# Patient Record
Sex: Female | Born: 1935
Health system: Southern US, Community
[De-identification: ages and names within clinical notes are randomized; demographics above are authoritative.]

## PROBLEM LIST (undated history)

## (undated) DIAGNOSIS — I10 Essential (primary) hypertension: Secondary | ICD-10-CM

## (undated) DIAGNOSIS — M1990 Unspecified osteoarthritis, unspecified site: Secondary | ICD-10-CM

## (undated) HISTORY — PX: CHOLECYSTECTOMY: SHX55

## (undated) HISTORY — PX: NECK SURGERY: SHX720

## (undated) HISTORY — PX: HERNIA REPAIR: SHX51

## (undated) HISTORY — PX: BACK SURGERY: SHX140

## (undated) HISTORY — PX: ABDOMINAL HYSTERECTOMY: SHX81

---

## 2015-05-05 DIAGNOSIS — E7800 Pure hypercholesterolemia, unspecified: Secondary | ICD-10-CM | POA: Diagnosis not present

## 2015-05-05 DIAGNOSIS — R300 Dysuria: Secondary | ICD-10-CM | POA: Diagnosis not present

## 2015-05-11 DIAGNOSIS — R609 Edema, unspecified: Secondary | ICD-10-CM | POA: Diagnosis not present

## 2015-05-11 DIAGNOSIS — R918 Other nonspecific abnormal finding of lung field: Secondary | ICD-10-CM | POA: Diagnosis not present

## 2015-05-11 DIAGNOSIS — R0609 Other forms of dyspnea: Secondary | ICD-10-CM | POA: Diagnosis not present

## 2015-05-11 DIAGNOSIS — I10 Essential (primary) hypertension: Secondary | ICD-10-CM | POA: Diagnosis not present

## 2015-05-11 DIAGNOSIS — J9811 Atelectasis: Secondary | ICD-10-CM | POA: Diagnosis not present

## 2015-05-11 DIAGNOSIS — E7800 Pure hypercholesterolemia, unspecified: Secondary | ICD-10-CM | POA: Diagnosis not present

## 2015-05-11 DIAGNOSIS — J9611 Chronic respiratory failure with hypoxia: Secondary | ICD-10-CM | POA: Diagnosis not present

## 2015-05-11 DIAGNOSIS — R0601 Orthopnea: Secondary | ICD-10-CM | POA: Diagnosis not present

## 2015-05-12 DIAGNOSIS — R079 Chest pain, unspecified: Secondary | ICD-10-CM | POA: Diagnosis not present

## 2015-05-12 DIAGNOSIS — Z87891 Personal history of nicotine dependence: Secondary | ICD-10-CM | POA: Diagnosis not present

## 2015-05-12 DIAGNOSIS — R7989 Other specified abnormal findings of blood chemistry: Secondary | ICD-10-CM | POA: Diagnosis not present

## 2015-05-12 DIAGNOSIS — I10 Essential (primary) hypertension: Secondary | ICD-10-CM | POA: Diagnosis not present

## 2015-05-12 DIAGNOSIS — Z85828 Personal history of other malignant neoplasm of skin: Secondary | ICD-10-CM | POA: Diagnosis not present

## 2015-05-12 DIAGNOSIS — I2510 Atherosclerotic heart disease of native coronary artery without angina pectoris: Secondary | ICD-10-CM | POA: Diagnosis not present

## 2015-05-12 DIAGNOSIS — K449 Diaphragmatic hernia without obstruction or gangrene: Secondary | ICD-10-CM | POA: Diagnosis not present

## 2015-05-12 DIAGNOSIS — I160 Hypertensive urgency: Secondary | ICD-10-CM | POA: Diagnosis not present

## 2015-05-12 DIAGNOSIS — R392 Extrarenal uremia: Secondary | ICD-10-CM | POA: Diagnosis not present

## 2015-05-12 DIAGNOSIS — K219 Gastro-esophageal reflux disease without esophagitis: Secondary | ICD-10-CM | POA: Diagnosis not present

## 2015-05-12 DIAGNOSIS — I252 Old myocardial infarction: Secondary | ICD-10-CM | POA: Diagnosis not present

## 2015-05-12 DIAGNOSIS — R0789 Other chest pain: Secondary | ICD-10-CM | POA: Diagnosis not present

## 2015-05-13 DIAGNOSIS — R0600 Dyspnea, unspecified: Secondary | ICD-10-CM | POA: Diagnosis not present

## 2015-05-17 DIAGNOSIS — R918 Other nonspecific abnormal finding of lung field: Secondary | ICD-10-CM | POA: Diagnosis not present

## 2015-05-17 DIAGNOSIS — I517 Cardiomegaly: Secondary | ICD-10-CM | POA: Diagnosis not present

## 2015-05-17 DIAGNOSIS — R0600 Dyspnea, unspecified: Secondary | ICD-10-CM | POA: Diagnosis not present

## 2015-05-17 DIAGNOSIS — I2510 Atherosclerotic heart disease of native coronary artery without angina pectoris: Secondary | ICD-10-CM | POA: Diagnosis not present

## 2015-05-17 DIAGNOSIS — K449 Diaphragmatic hernia without obstruction or gangrene: Secondary | ICD-10-CM | POA: Diagnosis not present

## 2015-05-17 DIAGNOSIS — I7090 Unspecified atherosclerosis: Secondary | ICD-10-CM | POA: Diagnosis not present

## 2015-06-07 DIAGNOSIS — R0600 Dyspnea, unspecified: Secondary | ICD-10-CM | POA: Diagnosis not present

## 2015-06-10 DIAGNOSIS — R0602 Shortness of breath: Secondary | ICD-10-CM | POA: Diagnosis not present

## 2015-07-22 DIAGNOSIS — M5431 Sciatica, right side: Secondary | ICD-10-CM | POA: Diagnosis not present

## 2015-07-26 DIAGNOSIS — M25551 Pain in right hip: Secondary | ICD-10-CM | POA: Diagnosis not present

## 2015-07-26 DIAGNOSIS — M545 Low back pain: Secondary | ICD-10-CM | POA: Diagnosis not present

## 2015-07-28 DIAGNOSIS — M25551 Pain in right hip: Secondary | ICD-10-CM | POA: Diagnosis not present

## 2015-07-28 DIAGNOSIS — M545 Low back pain: Secondary | ICD-10-CM | POA: Diagnosis not present

## 2015-07-30 DIAGNOSIS — M545 Low back pain: Secondary | ICD-10-CM | POA: Diagnosis not present

## 2015-07-30 DIAGNOSIS — M25551 Pain in right hip: Secondary | ICD-10-CM | POA: Diagnosis not present

## 2015-08-02 DIAGNOSIS — R0602 Shortness of breath: Secondary | ICD-10-CM | POA: Diagnosis not present

## 2015-08-02 DIAGNOSIS — J449 Chronic obstructive pulmonary disease, unspecified: Secondary | ICD-10-CM | POA: Diagnosis not present

## 2015-08-03 DIAGNOSIS — M25551 Pain in right hip: Secondary | ICD-10-CM | POA: Diagnosis not present

## 2015-08-03 DIAGNOSIS — M545 Low back pain: Secondary | ICD-10-CM | POA: Diagnosis not present

## 2015-08-05 DIAGNOSIS — M25551 Pain in right hip: Secondary | ICD-10-CM | POA: Diagnosis not present

## 2015-08-05 DIAGNOSIS — M545 Low back pain: Secondary | ICD-10-CM | POA: Diagnosis not present

## 2015-08-10 DIAGNOSIS — M545 Low back pain: Secondary | ICD-10-CM | POA: Diagnosis not present

## 2015-08-10 DIAGNOSIS — M25551 Pain in right hip: Secondary | ICD-10-CM | POA: Diagnosis not present

## 2015-08-12 DIAGNOSIS — M25551 Pain in right hip: Secondary | ICD-10-CM | POA: Diagnosis not present

## 2015-08-12 DIAGNOSIS — M545 Low back pain: Secondary | ICD-10-CM | POA: Diagnosis not present

## 2015-08-17 DIAGNOSIS — M545 Low back pain: Secondary | ICD-10-CM | POA: Diagnosis not present

## 2015-08-17 DIAGNOSIS — M25551 Pain in right hip: Secondary | ICD-10-CM | POA: Diagnosis not present

## 2015-08-19 DIAGNOSIS — M25551 Pain in right hip: Secondary | ICD-10-CM | POA: Diagnosis not present

## 2015-08-19 DIAGNOSIS — M545 Low back pain: Secondary | ICD-10-CM | POA: Diagnosis not present

## 2015-09-23 DIAGNOSIS — E7800 Pure hypercholesterolemia, unspecified: Secondary | ICD-10-CM | POA: Diagnosis not present

## 2015-09-23 DIAGNOSIS — I10 Essential (primary) hypertension: Secondary | ICD-10-CM | POA: Diagnosis not present

## 2015-09-30 DIAGNOSIS — R300 Dysuria: Secondary | ICD-10-CM | POA: Diagnosis not present

## 2015-09-30 DIAGNOSIS — R1084 Generalized abdominal pain: Secondary | ICD-10-CM | POA: Diagnosis not present

## 2015-09-30 DIAGNOSIS — E7800 Pure hypercholesterolemia, unspecified: Secondary | ICD-10-CM | POA: Diagnosis not present

## 2015-09-30 DIAGNOSIS — R0600 Dyspnea, unspecified: Secondary | ICD-10-CM | POA: Diagnosis not present

## 2015-09-30 DIAGNOSIS — I10 Essential (primary) hypertension: Secondary | ICD-10-CM | POA: Diagnosis not present

## 2015-10-14 DIAGNOSIS — K5730 Diverticulosis of large intestine without perforation or abscess without bleeding: Secondary | ICD-10-CM | POA: Diagnosis not present

## 2015-10-14 DIAGNOSIS — R1084 Generalized abdominal pain: Secondary | ICD-10-CM | POA: Diagnosis not present

## 2015-10-14 DIAGNOSIS — K449 Diaphragmatic hernia without obstruction or gangrene: Secondary | ICD-10-CM | POA: Diagnosis not present

## 2015-10-15 DIAGNOSIS — I10 Essential (primary) hypertension: Secondary | ICD-10-CM | POA: Diagnosis not present

## 2015-10-15 DIAGNOSIS — E785 Hyperlipidemia, unspecified: Secondary | ICD-10-CM | POA: Diagnosis not present

## 2015-10-15 DIAGNOSIS — R0609 Other forms of dyspnea: Secondary | ICD-10-CM | POA: Diagnosis not present

## 2015-10-15 DIAGNOSIS — K449 Diaphragmatic hernia without obstruction or gangrene: Secondary | ICD-10-CM | POA: Diagnosis not present

## 2015-10-22 DIAGNOSIS — R0609 Other forms of dyspnea: Secondary | ICD-10-CM | POA: Diagnosis not present

## 2015-10-22 DIAGNOSIS — R9431 Abnormal electrocardiogram [ECG] [EKG]: Secondary | ICD-10-CM | POA: Diagnosis not present

## 2015-10-22 DIAGNOSIS — I10 Essential (primary) hypertension: Secondary | ICD-10-CM | POA: Diagnosis not present

## 2015-10-27 DIAGNOSIS — R9431 Abnormal electrocardiogram [ECG] [EKG]: Secondary | ICD-10-CM | POA: Diagnosis not present

## 2015-10-27 DIAGNOSIS — R0609 Other forms of dyspnea: Secondary | ICD-10-CM | POA: Diagnosis not present

## 2015-10-27 DIAGNOSIS — I10 Essential (primary) hypertension: Secondary | ICD-10-CM | POA: Diagnosis not present

## 2015-11-01 DIAGNOSIS — E7800 Pure hypercholesterolemia, unspecified: Secondary | ICD-10-CM | POA: Diagnosis not present

## 2015-11-01 DIAGNOSIS — R0600 Dyspnea, unspecified: Secondary | ICD-10-CM | POA: Diagnosis not present

## 2016-01-27 DIAGNOSIS — M5441 Lumbago with sciatica, right side: Secondary | ICD-10-CM | POA: Diagnosis not present

## 2016-01-28 DIAGNOSIS — I2510 Atherosclerotic heart disease of native coronary artery without angina pectoris: Secondary | ICD-10-CM | POA: Diagnosis not present

## 2016-01-28 DIAGNOSIS — I252 Old myocardial infarction: Secondary | ICD-10-CM | POA: Diagnosis not present

## 2016-01-28 DIAGNOSIS — K219 Gastro-esophageal reflux disease without esophagitis: Secondary | ICD-10-CM | POA: Diagnosis not present

## 2016-01-28 DIAGNOSIS — Z87891 Personal history of nicotine dependence: Secondary | ICD-10-CM | POA: Diagnosis not present

## 2016-01-28 DIAGNOSIS — E785 Hyperlipidemia, unspecified: Secondary | ICD-10-CM | POA: Diagnosis not present

## 2016-01-28 DIAGNOSIS — M5441 Lumbago with sciatica, right side: Secondary | ICD-10-CM | POA: Diagnosis not present

## 2016-01-28 DIAGNOSIS — I10 Essential (primary) hypertension: Secondary | ICD-10-CM | POA: Diagnosis not present

## 2016-01-28 DIAGNOSIS — G8929 Other chronic pain: Secondary | ICD-10-CM | POA: Diagnosis not present

## 2016-01-28 DIAGNOSIS — M545 Low back pain: Secondary | ICD-10-CM | POA: Diagnosis not present

## 2016-01-28 DIAGNOSIS — K259 Gastric ulcer, unspecified as acute or chronic, without hemorrhage or perforation: Secondary | ICD-10-CM | POA: Diagnosis not present

## 2016-01-28 DIAGNOSIS — K449 Diaphragmatic hernia without obstruction or gangrene: Secondary | ICD-10-CM | POA: Diagnosis not present

## 2016-01-29 DIAGNOSIS — M545 Low back pain: Secondary | ICD-10-CM | POA: Diagnosis not present

## 2016-01-29 DIAGNOSIS — Z23 Encounter for immunization: Secondary | ICD-10-CM | POA: Diagnosis not present

## 2016-02-01 DIAGNOSIS — M5441 Lumbago with sciatica, right side: Secondary | ICD-10-CM | POA: Diagnosis not present

## 2016-02-07 DIAGNOSIS — E7800 Pure hypercholesterolemia, unspecified: Secondary | ICD-10-CM | POA: Diagnosis not present

## 2016-02-10 DIAGNOSIS — M5431 Sciatica, right side: Secondary | ICD-10-CM | POA: Diagnosis not present

## 2016-02-15 DIAGNOSIS — M4186 Other forms of scoliosis, lumbar region: Secondary | ICD-10-CM | POA: Diagnosis not present

## 2016-02-15 DIAGNOSIS — M545 Low back pain: Secondary | ICD-10-CM | POA: Diagnosis not present

## 2016-03-04 DIAGNOSIS — M545 Low back pain: Secondary | ICD-10-CM | POA: Diagnosis not present

## 2016-03-04 DIAGNOSIS — M4186 Other forms of scoliosis, lumbar region: Secondary | ICD-10-CM | POA: Diagnosis not present

## 2016-03-04 DIAGNOSIS — M5136 Other intervertebral disc degeneration, lumbar region: Secondary | ICD-10-CM | POA: Diagnosis not present

## 2016-03-07 DIAGNOSIS — M4186 Other forms of scoliosis, lumbar region: Secondary | ICD-10-CM | POA: Diagnosis not present

## 2016-05-18 DIAGNOSIS — M4327 Fusion of spine, lumbosacral region: Secondary | ICD-10-CM | POA: Diagnosis not present

## 2016-05-18 DIAGNOSIS — M9903 Segmental and somatic dysfunction of lumbar region: Secondary | ICD-10-CM | POA: Diagnosis not present

## 2016-05-18 DIAGNOSIS — M545 Low back pain: Secondary | ICD-10-CM | POA: Diagnosis not present

## 2016-05-18 DIAGNOSIS — M9904 Segmental and somatic dysfunction of sacral region: Secondary | ICD-10-CM | POA: Diagnosis not present

## 2016-05-19 DIAGNOSIS — M545 Low back pain: Secondary | ICD-10-CM | POA: Diagnosis not present

## 2016-05-19 DIAGNOSIS — M9904 Segmental and somatic dysfunction of sacral region: Secondary | ICD-10-CM | POA: Diagnosis not present

## 2016-05-19 DIAGNOSIS — M9903 Segmental and somatic dysfunction of lumbar region: Secondary | ICD-10-CM | POA: Diagnosis not present

## 2016-05-19 DIAGNOSIS — M4327 Fusion of spine, lumbosacral region: Secondary | ICD-10-CM | POA: Diagnosis not present

## 2016-05-22 DIAGNOSIS — M545 Low back pain: Secondary | ICD-10-CM | POA: Diagnosis not present

## 2016-05-22 DIAGNOSIS — M9904 Segmental and somatic dysfunction of sacral region: Secondary | ICD-10-CM | POA: Diagnosis not present

## 2016-05-22 DIAGNOSIS — M4327 Fusion of spine, lumbosacral region: Secondary | ICD-10-CM | POA: Diagnosis not present

## 2016-05-22 DIAGNOSIS — M9903 Segmental and somatic dysfunction of lumbar region: Secondary | ICD-10-CM | POA: Diagnosis not present

## 2016-05-25 DIAGNOSIS — M9903 Segmental and somatic dysfunction of lumbar region: Secondary | ICD-10-CM | POA: Diagnosis not present

## 2016-05-25 DIAGNOSIS — M4327 Fusion of spine, lumbosacral region: Secondary | ICD-10-CM | POA: Diagnosis not present

## 2016-05-25 DIAGNOSIS — M545 Low back pain: Secondary | ICD-10-CM | POA: Diagnosis not present

## 2016-05-25 DIAGNOSIS — M9904 Segmental and somatic dysfunction of sacral region: Secondary | ICD-10-CM | POA: Diagnosis not present

## 2016-05-26 DIAGNOSIS — M545 Low back pain: Secondary | ICD-10-CM | POA: Diagnosis not present

## 2016-05-26 DIAGNOSIS — M9904 Segmental and somatic dysfunction of sacral region: Secondary | ICD-10-CM | POA: Diagnosis not present

## 2016-05-26 DIAGNOSIS — M4327 Fusion of spine, lumbosacral region: Secondary | ICD-10-CM | POA: Diagnosis not present

## 2016-05-26 DIAGNOSIS — M9903 Segmental and somatic dysfunction of lumbar region: Secondary | ICD-10-CM | POA: Diagnosis not present

## 2016-05-29 DIAGNOSIS — M9903 Segmental and somatic dysfunction of lumbar region: Secondary | ICD-10-CM | POA: Diagnosis not present

## 2016-05-29 DIAGNOSIS — M4327 Fusion of spine, lumbosacral region: Secondary | ICD-10-CM | POA: Diagnosis not present

## 2016-05-29 DIAGNOSIS — M9904 Segmental and somatic dysfunction of sacral region: Secondary | ICD-10-CM | POA: Diagnosis not present

## 2016-05-29 DIAGNOSIS — M545 Low back pain: Secondary | ICD-10-CM | POA: Diagnosis not present

## 2016-05-31 DIAGNOSIS — M4327 Fusion of spine, lumbosacral region: Secondary | ICD-10-CM | POA: Diagnosis not present

## 2016-05-31 DIAGNOSIS — M9904 Segmental and somatic dysfunction of sacral region: Secondary | ICD-10-CM | POA: Diagnosis not present

## 2016-05-31 DIAGNOSIS — M9903 Segmental and somatic dysfunction of lumbar region: Secondary | ICD-10-CM | POA: Diagnosis not present

## 2016-05-31 DIAGNOSIS — M545 Low back pain: Secondary | ICD-10-CM | POA: Diagnosis not present

## 2016-06-01 DIAGNOSIS — M545 Low back pain: Secondary | ICD-10-CM | POA: Diagnosis not present

## 2016-06-01 DIAGNOSIS — M4327 Fusion of spine, lumbosacral region: Secondary | ICD-10-CM | POA: Diagnosis not present

## 2016-06-01 DIAGNOSIS — M9904 Segmental and somatic dysfunction of sacral region: Secondary | ICD-10-CM | POA: Diagnosis not present

## 2016-06-01 DIAGNOSIS — M9903 Segmental and somatic dysfunction of lumbar region: Secondary | ICD-10-CM | POA: Diagnosis not present

## 2016-06-02 DIAGNOSIS — M545 Low back pain: Secondary | ICD-10-CM | POA: Diagnosis not present

## 2016-06-02 DIAGNOSIS — M4327 Fusion of spine, lumbosacral region: Secondary | ICD-10-CM | POA: Diagnosis not present

## 2016-06-02 DIAGNOSIS — M9904 Segmental and somatic dysfunction of sacral region: Secondary | ICD-10-CM | POA: Diagnosis not present

## 2016-06-02 DIAGNOSIS — M9903 Segmental and somatic dysfunction of lumbar region: Secondary | ICD-10-CM | POA: Diagnosis not present

## 2016-06-05 DIAGNOSIS — M9904 Segmental and somatic dysfunction of sacral region: Secondary | ICD-10-CM | POA: Diagnosis not present

## 2016-06-05 DIAGNOSIS — M9903 Segmental and somatic dysfunction of lumbar region: Secondary | ICD-10-CM | POA: Diagnosis not present

## 2016-06-05 DIAGNOSIS — M545 Low back pain: Secondary | ICD-10-CM | POA: Diagnosis not present

## 2016-06-05 DIAGNOSIS — M4327 Fusion of spine, lumbosacral region: Secondary | ICD-10-CM | POA: Diagnosis not present

## 2016-06-07 DIAGNOSIS — M9903 Segmental and somatic dysfunction of lumbar region: Secondary | ICD-10-CM | POA: Diagnosis not present

## 2016-06-07 DIAGNOSIS — M4327 Fusion of spine, lumbosacral region: Secondary | ICD-10-CM | POA: Diagnosis not present

## 2016-06-07 DIAGNOSIS — M9904 Segmental and somatic dysfunction of sacral region: Secondary | ICD-10-CM | POA: Diagnosis not present

## 2016-06-07 DIAGNOSIS — M545 Low back pain: Secondary | ICD-10-CM | POA: Diagnosis not present

## 2016-06-08 DIAGNOSIS — M9903 Segmental and somatic dysfunction of lumbar region: Secondary | ICD-10-CM | POA: Diagnosis not present

## 2016-06-08 DIAGNOSIS — M545 Low back pain: Secondary | ICD-10-CM | POA: Diagnosis not present

## 2016-06-08 DIAGNOSIS — M4327 Fusion of spine, lumbosacral region: Secondary | ICD-10-CM | POA: Diagnosis not present

## 2016-06-08 DIAGNOSIS — M9904 Segmental and somatic dysfunction of sacral region: Secondary | ICD-10-CM | POA: Diagnosis not present

## 2016-06-09 DIAGNOSIS — M4327 Fusion of spine, lumbosacral region: Secondary | ICD-10-CM | POA: Diagnosis not present

## 2016-06-09 DIAGNOSIS — M545 Low back pain: Secondary | ICD-10-CM | POA: Diagnosis not present

## 2016-06-09 DIAGNOSIS — M9904 Segmental and somatic dysfunction of sacral region: Secondary | ICD-10-CM | POA: Diagnosis not present

## 2016-06-09 DIAGNOSIS — M9903 Segmental and somatic dysfunction of lumbar region: Secondary | ICD-10-CM | POA: Diagnosis not present

## 2016-06-19 DIAGNOSIS — E7800 Pure hypercholesterolemia, unspecified: Secondary | ICD-10-CM | POA: Diagnosis not present

## 2016-06-26 DIAGNOSIS — I10 Essential (primary) hypertension: Secondary | ICD-10-CM | POA: Diagnosis not present

## 2016-06-26 DIAGNOSIS — E7800 Pure hypercholesterolemia, unspecified: Secondary | ICD-10-CM | POA: Diagnosis not present

## 2016-09-20 DIAGNOSIS — B372 Candidiasis of skin and nail: Secondary | ICD-10-CM | POA: Diagnosis not present

## 2016-10-03 DIAGNOSIS — R21 Rash and other nonspecific skin eruption: Secondary | ICD-10-CM | POA: Diagnosis not present

## 2016-10-03 DIAGNOSIS — E7800 Pure hypercholesterolemia, unspecified: Secondary | ICD-10-CM | POA: Diagnosis not present

## 2016-10-04 DIAGNOSIS — H43811 Vitreous degeneration, right eye: Secondary | ICD-10-CM | POA: Diagnosis not present

## 2016-10-10 DIAGNOSIS — K219 Gastro-esophageal reflux disease without esophagitis: Secondary | ICD-10-CM | POA: Diagnosis not present

## 2016-10-10 DIAGNOSIS — I10 Essential (primary) hypertension: Secondary | ICD-10-CM | POA: Diagnosis not present

## 2016-10-10 DIAGNOSIS — K210 Gastro-esophageal reflux disease with esophagitis: Secondary | ICD-10-CM | POA: Diagnosis not present

## 2016-10-10 DIAGNOSIS — R252 Cramp and spasm: Secondary | ICD-10-CM | POA: Diagnosis not present

## 2016-10-10 DIAGNOSIS — D750 Familial erythrocytosis: Secondary | ICD-10-CM | POA: Diagnosis not present

## 2016-10-10 DIAGNOSIS — E7800 Pure hypercholesterolemia, unspecified: Secondary | ICD-10-CM | POA: Diagnosis not present

## 2016-10-10 DIAGNOSIS — M791 Myalgia: Secondary | ICD-10-CM | POA: Diagnosis not present

## 2016-10-17 DIAGNOSIS — R252 Cramp and spasm: Secondary | ICD-10-CM | POA: Diagnosis not present

## 2016-10-17 DIAGNOSIS — I10 Essential (primary) hypertension: Secondary | ICD-10-CM | POA: Diagnosis not present

## 2016-10-31 DIAGNOSIS — I10 Essential (primary) hypertension: Secondary | ICD-10-CM | POA: Diagnosis not present

## 2016-10-31 DIAGNOSIS — E7800 Pure hypercholesterolemia, unspecified: Secondary | ICD-10-CM | POA: Diagnosis not present

## 2016-11-16 DIAGNOSIS — M5136 Other intervertebral disc degeneration, lumbar region: Secondary | ICD-10-CM | POA: Diagnosis not present

## 2016-11-16 DIAGNOSIS — M9905 Segmental and somatic dysfunction of pelvic region: Secondary | ICD-10-CM | POA: Diagnosis not present

## 2016-11-16 DIAGNOSIS — M6240 Contracture of muscle, unspecified site: Secondary | ICD-10-CM | POA: Diagnosis not present

## 2016-11-16 DIAGNOSIS — M9903 Segmental and somatic dysfunction of lumbar region: Secondary | ICD-10-CM | POA: Diagnosis not present

## 2016-11-20 DIAGNOSIS — M9903 Segmental and somatic dysfunction of lumbar region: Secondary | ICD-10-CM | POA: Diagnosis not present

## 2016-11-20 DIAGNOSIS — M6240 Contracture of muscle, unspecified site: Secondary | ICD-10-CM | POA: Diagnosis not present

## 2016-11-20 DIAGNOSIS — M9905 Segmental and somatic dysfunction of pelvic region: Secondary | ICD-10-CM | POA: Diagnosis not present

## 2016-11-20 DIAGNOSIS — M5136 Other intervertebral disc degeneration, lumbar region: Secondary | ICD-10-CM | POA: Diagnosis not present

## 2016-11-21 DIAGNOSIS — M5136 Other intervertebral disc degeneration, lumbar region: Secondary | ICD-10-CM | POA: Diagnosis not present

## 2016-11-21 DIAGNOSIS — M9903 Segmental and somatic dysfunction of lumbar region: Secondary | ICD-10-CM | POA: Diagnosis not present

## 2016-11-21 DIAGNOSIS — M6240 Contracture of muscle, unspecified site: Secondary | ICD-10-CM | POA: Diagnosis not present

## 2016-11-21 DIAGNOSIS — M9905 Segmental and somatic dysfunction of pelvic region: Secondary | ICD-10-CM | POA: Diagnosis not present

## 2016-11-23 DIAGNOSIS — M9905 Segmental and somatic dysfunction of pelvic region: Secondary | ICD-10-CM | POA: Diagnosis not present

## 2016-11-23 DIAGNOSIS — M5136 Other intervertebral disc degeneration, lumbar region: Secondary | ICD-10-CM | POA: Diagnosis not present

## 2016-11-23 DIAGNOSIS — M9903 Segmental and somatic dysfunction of lumbar region: Secondary | ICD-10-CM | POA: Diagnosis not present

## 2016-11-23 DIAGNOSIS — M6240 Contracture of muscle, unspecified site: Secondary | ICD-10-CM | POA: Diagnosis not present

## 2016-11-28 DIAGNOSIS — M6240 Contracture of muscle, unspecified site: Secondary | ICD-10-CM | POA: Diagnosis not present

## 2016-11-28 DIAGNOSIS — M9903 Segmental and somatic dysfunction of lumbar region: Secondary | ICD-10-CM | POA: Diagnosis not present

## 2016-11-28 DIAGNOSIS — M5136 Other intervertebral disc degeneration, lumbar region: Secondary | ICD-10-CM | POA: Diagnosis not present

## 2016-11-28 DIAGNOSIS — M9905 Segmental and somatic dysfunction of pelvic region: Secondary | ICD-10-CM | POA: Diagnosis not present

## 2016-11-30 DIAGNOSIS — M5136 Other intervertebral disc degeneration, lumbar region: Secondary | ICD-10-CM | POA: Diagnosis not present

## 2016-11-30 DIAGNOSIS — M9905 Segmental and somatic dysfunction of pelvic region: Secondary | ICD-10-CM | POA: Diagnosis not present

## 2016-11-30 DIAGNOSIS — M9903 Segmental and somatic dysfunction of lumbar region: Secondary | ICD-10-CM | POA: Diagnosis not present

## 2016-11-30 DIAGNOSIS — M6240 Contracture of muscle, unspecified site: Secondary | ICD-10-CM | POA: Diagnosis not present

## 2016-12-06 DIAGNOSIS — M9903 Segmental and somatic dysfunction of lumbar region: Secondary | ICD-10-CM | POA: Diagnosis not present

## 2016-12-06 DIAGNOSIS — M6240 Contracture of muscle, unspecified site: Secondary | ICD-10-CM | POA: Diagnosis not present

## 2016-12-06 DIAGNOSIS — M9905 Segmental and somatic dysfunction of pelvic region: Secondary | ICD-10-CM | POA: Diagnosis not present

## 2016-12-06 DIAGNOSIS — M5136 Other intervertebral disc degeneration, lumbar region: Secondary | ICD-10-CM | POA: Diagnosis not present

## 2016-12-07 DIAGNOSIS — M9903 Segmental and somatic dysfunction of lumbar region: Secondary | ICD-10-CM | POA: Diagnosis not present

## 2016-12-07 DIAGNOSIS — M6240 Contracture of muscle, unspecified site: Secondary | ICD-10-CM | POA: Diagnosis not present

## 2016-12-07 DIAGNOSIS — M5136 Other intervertebral disc degeneration, lumbar region: Secondary | ICD-10-CM | POA: Diagnosis not present

## 2016-12-07 DIAGNOSIS — M9905 Segmental and somatic dysfunction of pelvic region: Secondary | ICD-10-CM | POA: Diagnosis not present

## 2016-12-21 DIAGNOSIS — M6240 Contracture of muscle, unspecified site: Secondary | ICD-10-CM | POA: Diagnosis not present

## 2016-12-21 DIAGNOSIS — M9905 Segmental and somatic dysfunction of pelvic region: Secondary | ICD-10-CM | POA: Diagnosis not present

## 2016-12-21 DIAGNOSIS — M5136 Other intervertebral disc degeneration, lumbar region: Secondary | ICD-10-CM | POA: Diagnosis not present

## 2016-12-21 DIAGNOSIS — M9903 Segmental and somatic dysfunction of lumbar region: Secondary | ICD-10-CM | POA: Diagnosis not present

## 2017-01-25 DIAGNOSIS — I10 Essential (primary) hypertension: Secondary | ICD-10-CM | POA: Diagnosis not present

## 2017-01-25 DIAGNOSIS — H6980 Other specified disorders of Eustachian tube, unspecified ear: Secondary | ICD-10-CM | POA: Diagnosis not present

## 2017-01-25 DIAGNOSIS — R42 Dizziness and giddiness: Secondary | ICD-10-CM | POA: Diagnosis not present

## 2017-03-26 DIAGNOSIS — D485 Neoplasm of uncertain behavior of skin: Secondary | ICD-10-CM | POA: Diagnosis not present

## 2017-03-26 DIAGNOSIS — D1720 Benign lipomatous neoplasm of skin and subcutaneous tissue of unspecified limb: Secondary | ICD-10-CM | POA: Diagnosis not present

## 2017-03-26 DIAGNOSIS — D0461 Carcinoma in situ of skin of right upper limb, including shoulder: Secondary | ICD-10-CM | POA: Diagnosis not present

## 2017-03-26 DIAGNOSIS — Z85828 Personal history of other malignant neoplasm of skin: Secondary | ICD-10-CM | POA: Diagnosis not present

## 2017-03-26 DIAGNOSIS — Z23 Encounter for immunization: Secondary | ICD-10-CM | POA: Diagnosis not present

## 2017-03-26 DIAGNOSIS — L821 Other seborrheic keratosis: Secondary | ICD-10-CM | POA: Diagnosis not present

## 2017-03-26 DIAGNOSIS — L570 Actinic keratosis: Secondary | ICD-10-CM | POA: Diagnosis not present

## 2017-04-09 DIAGNOSIS — M5441 Lumbago with sciatica, right side: Secondary | ICD-10-CM | POA: Diagnosis not present

## 2017-04-09 DIAGNOSIS — Z1231 Encounter for screening mammogram for malignant neoplasm of breast: Secondary | ICD-10-CM | POA: Diagnosis not present

## 2017-04-09 DIAGNOSIS — K219 Gastro-esophageal reflux disease without esophagitis: Secondary | ICD-10-CM | POA: Diagnosis not present

## 2017-04-09 DIAGNOSIS — M5442 Lumbago with sciatica, left side: Secondary | ICD-10-CM | POA: Diagnosis not present

## 2017-04-09 DIAGNOSIS — I10 Essential (primary) hypertension: Secondary | ICD-10-CM | POA: Diagnosis not present

## 2017-04-09 DIAGNOSIS — G8929 Other chronic pain: Secondary | ICD-10-CM | POA: Diagnosis not present

## 2017-04-12 ENCOUNTER — Other Ambulatory Visit: Payer: Self-pay | Admitting: Family Medicine

## 2017-04-12 DIAGNOSIS — Z1231 Encounter for screening mammogram for malignant neoplasm of breast: Secondary | ICD-10-CM

## 2017-04-26 ENCOUNTER — Ambulatory Visit
Admission: RE | Admit: 2017-04-26 | Discharge: 2017-04-26 | Disposition: A | Discharge status: Home / Self Care | Payer: Medicare Other | Source: Ambulatory Visit | Attending: Family Medicine | Admitting: Family Medicine

## 2017-04-26 DIAGNOSIS — Z1231 Encounter for screening mammogram for malignant neoplasm of breast: Secondary | ICD-10-CM | POA: Diagnosis not present

## 2017-05-01 DIAGNOSIS — Z411 Encounter for cosmetic surgery: Secondary | ICD-10-CM | POA: Diagnosis not present

## 2017-05-01 DIAGNOSIS — D0461 Carcinoma in situ of skin of right upper limb, including shoulder: Secondary | ICD-10-CM | POA: Diagnosis not present

## 2017-06-04 DIAGNOSIS — R109 Unspecified abdominal pain: Secondary | ICD-10-CM | POA: Diagnosis not present

## 2017-06-04 DIAGNOSIS — K449 Diaphragmatic hernia without obstruction or gangrene: Secondary | ICD-10-CM | POA: Diagnosis not present

## 2017-06-04 DIAGNOSIS — R1030 Lower abdominal pain, unspecified: Secondary | ICD-10-CM | POA: Diagnosis not present

## 2017-06-04 DIAGNOSIS — Z9049 Acquired absence of other specified parts of digestive tract: Secondary | ICD-10-CM | POA: Diagnosis not present

## 2017-06-04 DIAGNOSIS — M79604 Pain in right leg: Secondary | ICD-10-CM | POA: Diagnosis not present

## 2017-06-04 DIAGNOSIS — I10 Essential (primary) hypertension: Secondary | ICD-10-CM | POA: Diagnosis not present

## 2017-06-04 DIAGNOSIS — N390 Urinary tract infection, site not specified: Secondary | ICD-10-CM | POA: Diagnosis not present

## 2017-06-04 DIAGNOSIS — I700 Atherosclerosis of aorta: Secondary | ICD-10-CM | POA: Diagnosis not present

## 2017-06-04 DIAGNOSIS — R350 Frequency of micturition: Secondary | ICD-10-CM | POA: Diagnosis not present

## 2017-06-04 DIAGNOSIS — K5790 Diverticulosis of intestine, part unspecified, without perforation or abscess without bleeding: Secondary | ICD-10-CM | POA: Diagnosis not present

## 2017-06-04 DIAGNOSIS — Z880 Allergy status to penicillin: Secondary | ICD-10-CM | POA: Diagnosis not present

## 2017-06-04 DIAGNOSIS — B9689 Other specified bacterial agents as the cause of diseases classified elsewhere: Secondary | ICD-10-CM | POA: Diagnosis not present

## 2017-06-12 DIAGNOSIS — G8929 Other chronic pain: Secondary | ICD-10-CM | POA: Diagnosis not present

## 2017-06-12 DIAGNOSIS — N390 Urinary tract infection, site not specified: Secondary | ICD-10-CM | POA: Diagnosis not present

## 2017-06-12 DIAGNOSIS — R05 Cough: Secondary | ICD-10-CM | POA: Diagnosis not present

## 2017-06-12 DIAGNOSIS — M5442 Lumbago with sciatica, left side: Secondary | ICD-10-CM | POA: Diagnosis not present

## 2017-06-12 DIAGNOSIS — J069 Acute upper respiratory infection, unspecified: Secondary | ICD-10-CM | POA: Diagnosis not present

## 2017-07-25 DIAGNOSIS — M545 Low back pain: Secondary | ICD-10-CM | POA: Diagnosis not present

## 2017-08-16 DIAGNOSIS — M545 Low back pain: Secondary | ICD-10-CM | POA: Diagnosis not present

## 2017-08-16 DIAGNOSIS — M4150 Other secondary scoliosis, site unspecified: Secondary | ICD-10-CM | POA: Diagnosis not present

## 2017-08-16 DIAGNOSIS — M5432 Sciatica, left side: Secondary | ICD-10-CM | POA: Diagnosis not present

## 2017-08-16 DIAGNOSIS — M5431 Sciatica, right side: Secondary | ICD-10-CM | POA: Diagnosis not present

## 2017-08-16 DIAGNOSIS — M549 Dorsalgia, unspecified: Secondary | ICD-10-CM | POA: Diagnosis not present

## 2017-08-26 DIAGNOSIS — N390 Urinary tract infection, site not specified: Secondary | ICD-10-CM | POA: Diagnosis not present

## 2017-08-26 DIAGNOSIS — R109 Unspecified abdominal pain: Secondary | ICD-10-CM | POA: Diagnosis not present

## 2017-09-10 DIAGNOSIS — M5442 Lumbago with sciatica, left side: Secondary | ICD-10-CM | POA: Diagnosis not present

## 2017-10-12 DIAGNOSIS — N3941 Urge incontinence: Secondary | ICD-10-CM | POA: Diagnosis not present

## 2017-10-12 DIAGNOSIS — R32 Unspecified urinary incontinence: Secondary | ICD-10-CM | POA: Diagnosis not present

## 2017-12-12 DIAGNOSIS — M5442 Lumbago with sciatica, left side: Secondary | ICD-10-CM | POA: Diagnosis not present

## 2017-12-12 DIAGNOSIS — I10 Essential (primary) hypertension: Secondary | ICD-10-CM | POA: Diagnosis not present

## 2017-12-12 DIAGNOSIS — R221 Localized swelling, mass and lump, neck: Secondary | ICD-10-CM | POA: Diagnosis not present

## 2017-12-12 DIAGNOSIS — R1310 Dysphagia, unspecified: Secondary | ICD-10-CM | POA: Diagnosis not present

## 2017-12-14 DIAGNOSIS — N3941 Urge incontinence: Secondary | ICD-10-CM | POA: Diagnosis not present

## 2018-07-11 DIAGNOSIS — M5416 Radiculopathy, lumbar region: Secondary | ICD-10-CM | POA: Diagnosis not present

## 2018-07-11 DIAGNOSIS — M9905 Segmental and somatic dysfunction of pelvic region: Secondary | ICD-10-CM | POA: Diagnosis not present

## 2018-07-11 DIAGNOSIS — R51 Headache: Secondary | ICD-10-CM | POA: Diagnosis not present

## 2018-07-11 DIAGNOSIS — M530 Cervicocranial syndrome: Secondary | ICD-10-CM | POA: Diagnosis not present

## 2018-07-11 DIAGNOSIS — M9904 Segmental and somatic dysfunction of sacral region: Secondary | ICD-10-CM | POA: Diagnosis not present

## 2018-07-11 DIAGNOSIS — M9901 Segmental and somatic dysfunction of cervical region: Secondary | ICD-10-CM | POA: Diagnosis not present

## 2018-07-11 DIAGNOSIS — M5136 Other intervertebral disc degeneration, lumbar region: Secondary | ICD-10-CM | POA: Diagnosis not present

## 2018-07-11 DIAGNOSIS — M4312 Spondylolisthesis, cervical region: Secondary | ICD-10-CM | POA: Diagnosis not present

## 2018-07-11 DIAGNOSIS — M9903 Segmental and somatic dysfunction of lumbar region: Secondary | ICD-10-CM | POA: Diagnosis not present

## 2018-07-12 DIAGNOSIS — M5416 Radiculopathy, lumbar region: Secondary | ICD-10-CM | POA: Diagnosis not present

## 2018-07-12 DIAGNOSIS — R51 Headache: Secondary | ICD-10-CM | POA: Diagnosis not present

## 2018-07-12 DIAGNOSIS — M9903 Segmental and somatic dysfunction of lumbar region: Secondary | ICD-10-CM | POA: Diagnosis not present

## 2018-07-12 DIAGNOSIS — M9901 Segmental and somatic dysfunction of cervical region: Secondary | ICD-10-CM | POA: Diagnosis not present

## 2018-07-12 DIAGNOSIS — M4312 Spondylolisthesis, cervical region: Secondary | ICD-10-CM | POA: Diagnosis not present

## 2018-07-12 DIAGNOSIS — M530 Cervicocranial syndrome: Secondary | ICD-10-CM | POA: Diagnosis not present

## 2018-07-12 DIAGNOSIS — M9904 Segmental and somatic dysfunction of sacral region: Secondary | ICD-10-CM | POA: Diagnosis not present

## 2018-07-12 DIAGNOSIS — M5136 Other intervertebral disc degeneration, lumbar region: Secondary | ICD-10-CM | POA: Diagnosis not present

## 2018-07-12 DIAGNOSIS — M9905 Segmental and somatic dysfunction of pelvic region: Secondary | ICD-10-CM | POA: Diagnosis not present

## 2018-07-15 DIAGNOSIS — M5136 Other intervertebral disc degeneration, lumbar region: Secondary | ICD-10-CM | POA: Diagnosis not present

## 2018-07-15 DIAGNOSIS — M9905 Segmental and somatic dysfunction of pelvic region: Secondary | ICD-10-CM | POA: Diagnosis not present

## 2018-07-15 DIAGNOSIS — M9904 Segmental and somatic dysfunction of sacral region: Secondary | ICD-10-CM | POA: Diagnosis not present

## 2018-07-15 DIAGNOSIS — M530 Cervicocranial syndrome: Secondary | ICD-10-CM | POA: Diagnosis not present

## 2018-07-15 DIAGNOSIS — M9903 Segmental and somatic dysfunction of lumbar region: Secondary | ICD-10-CM | POA: Diagnosis not present

## 2018-07-15 DIAGNOSIS — M5416 Radiculopathy, lumbar region: Secondary | ICD-10-CM | POA: Diagnosis not present

## 2018-07-15 DIAGNOSIS — R51 Headache: Secondary | ICD-10-CM | POA: Diagnosis not present

## 2018-07-15 DIAGNOSIS — M9901 Segmental and somatic dysfunction of cervical region: Secondary | ICD-10-CM | POA: Diagnosis not present

## 2018-07-15 DIAGNOSIS — M4312 Spondylolisthesis, cervical region: Secondary | ICD-10-CM | POA: Diagnosis not present

## 2018-07-19 DIAGNOSIS — M9905 Segmental and somatic dysfunction of pelvic region: Secondary | ICD-10-CM | POA: Diagnosis not present

## 2018-07-19 DIAGNOSIS — M9901 Segmental and somatic dysfunction of cervical region: Secondary | ICD-10-CM | POA: Diagnosis not present

## 2018-07-19 DIAGNOSIS — M9904 Segmental and somatic dysfunction of sacral region: Secondary | ICD-10-CM | POA: Diagnosis not present

## 2018-07-19 DIAGNOSIS — M5136 Other intervertebral disc degeneration, lumbar region: Secondary | ICD-10-CM | POA: Diagnosis not present

## 2018-07-19 DIAGNOSIS — R51 Headache: Secondary | ICD-10-CM | POA: Diagnosis not present

## 2018-07-19 DIAGNOSIS — M530 Cervicocranial syndrome: Secondary | ICD-10-CM | POA: Diagnosis not present

## 2018-07-19 DIAGNOSIS — M5416 Radiculopathy, lumbar region: Secondary | ICD-10-CM | POA: Diagnosis not present

## 2018-07-19 DIAGNOSIS — M4312 Spondylolisthesis, cervical region: Secondary | ICD-10-CM | POA: Diagnosis not present

## 2018-07-19 DIAGNOSIS — M9903 Segmental and somatic dysfunction of lumbar region: Secondary | ICD-10-CM | POA: Diagnosis not present

## 2018-07-24 DIAGNOSIS — M9905 Segmental and somatic dysfunction of pelvic region: Secondary | ICD-10-CM | POA: Diagnosis not present

## 2018-07-24 DIAGNOSIS — M9903 Segmental and somatic dysfunction of lumbar region: Secondary | ICD-10-CM | POA: Diagnosis not present

## 2018-07-24 DIAGNOSIS — M4312 Spondylolisthesis, cervical region: Secondary | ICD-10-CM | POA: Diagnosis not present

## 2018-07-24 DIAGNOSIS — M9904 Segmental and somatic dysfunction of sacral region: Secondary | ICD-10-CM | POA: Diagnosis not present

## 2018-07-24 DIAGNOSIS — M9901 Segmental and somatic dysfunction of cervical region: Secondary | ICD-10-CM | POA: Diagnosis not present

## 2018-07-24 DIAGNOSIS — M530 Cervicocranial syndrome: Secondary | ICD-10-CM | POA: Diagnosis not present

## 2018-07-24 DIAGNOSIS — R51 Headache: Secondary | ICD-10-CM | POA: Diagnosis not present

## 2018-07-24 DIAGNOSIS — M5416 Radiculopathy, lumbar region: Secondary | ICD-10-CM | POA: Diagnosis not present

## 2018-07-24 DIAGNOSIS — M5136 Other intervertebral disc degeneration, lumbar region: Secondary | ICD-10-CM | POA: Diagnosis not present

## 2018-07-29 DIAGNOSIS — M9904 Segmental and somatic dysfunction of sacral region: Secondary | ICD-10-CM | POA: Diagnosis not present

## 2018-07-29 DIAGNOSIS — M9905 Segmental and somatic dysfunction of pelvic region: Secondary | ICD-10-CM | POA: Diagnosis not present

## 2018-07-29 DIAGNOSIS — M4312 Spondylolisthesis, cervical region: Secondary | ICD-10-CM | POA: Diagnosis not present

## 2018-07-29 DIAGNOSIS — M5416 Radiculopathy, lumbar region: Secondary | ICD-10-CM | POA: Diagnosis not present

## 2018-07-29 DIAGNOSIS — M530 Cervicocranial syndrome: Secondary | ICD-10-CM | POA: Diagnosis not present

## 2018-07-29 DIAGNOSIS — R51 Headache: Secondary | ICD-10-CM | POA: Diagnosis not present

## 2018-07-29 DIAGNOSIS — M9901 Segmental and somatic dysfunction of cervical region: Secondary | ICD-10-CM | POA: Diagnosis not present

## 2018-07-29 DIAGNOSIS — M9903 Segmental and somatic dysfunction of lumbar region: Secondary | ICD-10-CM | POA: Diagnosis not present

## 2018-07-29 DIAGNOSIS — M5136 Other intervertebral disc degeneration, lumbar region: Secondary | ICD-10-CM | POA: Diagnosis not present

## 2018-07-31 DIAGNOSIS — M9903 Segmental and somatic dysfunction of lumbar region: Secondary | ICD-10-CM | POA: Diagnosis not present

## 2018-07-31 DIAGNOSIS — M530 Cervicocranial syndrome: Secondary | ICD-10-CM | POA: Diagnosis not present

## 2018-07-31 DIAGNOSIS — M4312 Spondylolisthesis, cervical region: Secondary | ICD-10-CM | POA: Diagnosis not present

## 2018-07-31 DIAGNOSIS — M5136 Other intervertebral disc degeneration, lumbar region: Secondary | ICD-10-CM | POA: Diagnosis not present

## 2018-07-31 DIAGNOSIS — M9901 Segmental and somatic dysfunction of cervical region: Secondary | ICD-10-CM | POA: Diagnosis not present

## 2018-07-31 DIAGNOSIS — R51 Headache: Secondary | ICD-10-CM | POA: Diagnosis not present

## 2018-07-31 DIAGNOSIS — M9905 Segmental and somatic dysfunction of pelvic region: Secondary | ICD-10-CM | POA: Diagnosis not present

## 2018-07-31 DIAGNOSIS — M5416 Radiculopathy, lumbar region: Secondary | ICD-10-CM | POA: Diagnosis not present

## 2018-07-31 DIAGNOSIS — M9904 Segmental and somatic dysfunction of sacral region: Secondary | ICD-10-CM | POA: Diagnosis not present

## 2018-08-01 DIAGNOSIS — M530 Cervicocranial syndrome: Secondary | ICD-10-CM | POA: Diagnosis not present

## 2018-08-01 DIAGNOSIS — M9904 Segmental and somatic dysfunction of sacral region: Secondary | ICD-10-CM | POA: Diagnosis not present

## 2018-08-01 DIAGNOSIS — M4312 Spondylolisthesis, cervical region: Secondary | ICD-10-CM | POA: Diagnosis not present

## 2018-08-01 DIAGNOSIS — M9905 Segmental and somatic dysfunction of pelvic region: Secondary | ICD-10-CM | POA: Diagnosis not present

## 2018-08-01 DIAGNOSIS — M5416 Radiculopathy, lumbar region: Secondary | ICD-10-CM | POA: Diagnosis not present

## 2018-08-01 DIAGNOSIS — R51 Headache: Secondary | ICD-10-CM | POA: Diagnosis not present

## 2018-08-01 DIAGNOSIS — M9903 Segmental and somatic dysfunction of lumbar region: Secondary | ICD-10-CM | POA: Diagnosis not present

## 2018-08-01 DIAGNOSIS — M9901 Segmental and somatic dysfunction of cervical region: Secondary | ICD-10-CM | POA: Diagnosis not present

## 2018-08-01 DIAGNOSIS — M5136 Other intervertebral disc degeneration, lumbar region: Secondary | ICD-10-CM | POA: Diagnosis not present

## 2018-08-07 DIAGNOSIS — M9904 Segmental and somatic dysfunction of sacral region: Secondary | ICD-10-CM | POA: Diagnosis not present

## 2018-08-07 DIAGNOSIS — M4312 Spondylolisthesis, cervical region: Secondary | ICD-10-CM | POA: Diagnosis not present

## 2018-08-07 DIAGNOSIS — M530 Cervicocranial syndrome: Secondary | ICD-10-CM | POA: Diagnosis not present

## 2018-08-07 DIAGNOSIS — M5416 Radiculopathy, lumbar region: Secondary | ICD-10-CM | POA: Diagnosis not present

## 2018-08-07 DIAGNOSIS — M9903 Segmental and somatic dysfunction of lumbar region: Secondary | ICD-10-CM | POA: Diagnosis not present

## 2018-08-07 DIAGNOSIS — M9905 Segmental and somatic dysfunction of pelvic region: Secondary | ICD-10-CM | POA: Diagnosis not present

## 2018-08-07 DIAGNOSIS — M5136 Other intervertebral disc degeneration, lumbar region: Secondary | ICD-10-CM | POA: Diagnosis not present

## 2018-08-07 DIAGNOSIS — R51 Headache: Secondary | ICD-10-CM | POA: Diagnosis not present

## 2018-08-07 DIAGNOSIS — M9901 Segmental and somatic dysfunction of cervical region: Secondary | ICD-10-CM | POA: Diagnosis not present

## 2018-08-12 DIAGNOSIS — M5416 Radiculopathy, lumbar region: Secondary | ICD-10-CM | POA: Diagnosis not present

## 2018-08-12 DIAGNOSIS — M5136 Other intervertebral disc degeneration, lumbar region: Secondary | ICD-10-CM | POA: Diagnosis not present

## 2018-08-12 DIAGNOSIS — M9901 Segmental and somatic dysfunction of cervical region: Secondary | ICD-10-CM | POA: Diagnosis not present

## 2018-08-12 DIAGNOSIS — M530 Cervicocranial syndrome: Secondary | ICD-10-CM | POA: Diagnosis not present

## 2018-08-12 DIAGNOSIS — R51 Headache: Secondary | ICD-10-CM | POA: Diagnosis not present

## 2018-08-12 DIAGNOSIS — M9903 Segmental and somatic dysfunction of lumbar region: Secondary | ICD-10-CM | POA: Diagnosis not present

## 2018-08-12 DIAGNOSIS — M9904 Segmental and somatic dysfunction of sacral region: Secondary | ICD-10-CM | POA: Diagnosis not present

## 2018-08-12 DIAGNOSIS — M9905 Segmental and somatic dysfunction of pelvic region: Secondary | ICD-10-CM | POA: Diagnosis not present

## 2018-08-12 DIAGNOSIS — M4312 Spondylolisthesis, cervical region: Secondary | ICD-10-CM | POA: Diagnosis not present

## 2018-08-21 DIAGNOSIS — M9901 Segmental and somatic dysfunction of cervical region: Secondary | ICD-10-CM | POA: Diagnosis not present

## 2018-08-21 DIAGNOSIS — M9905 Segmental and somatic dysfunction of pelvic region: Secondary | ICD-10-CM | POA: Diagnosis not present

## 2018-08-21 DIAGNOSIS — M9903 Segmental and somatic dysfunction of lumbar region: Secondary | ICD-10-CM | POA: Diagnosis not present

## 2018-08-21 DIAGNOSIS — R51 Headache: Secondary | ICD-10-CM | POA: Diagnosis not present

## 2018-08-21 DIAGNOSIS — M4312 Spondylolisthesis, cervical region: Secondary | ICD-10-CM | POA: Diagnosis not present

## 2018-08-21 DIAGNOSIS — M5136 Other intervertebral disc degeneration, lumbar region: Secondary | ICD-10-CM | POA: Diagnosis not present

## 2018-08-21 DIAGNOSIS — M9904 Segmental and somatic dysfunction of sacral region: Secondary | ICD-10-CM | POA: Diagnosis not present

## 2018-08-21 DIAGNOSIS — M530 Cervicocranial syndrome: Secondary | ICD-10-CM | POA: Diagnosis not present

## 2018-08-21 DIAGNOSIS — M5416 Radiculopathy, lumbar region: Secondary | ICD-10-CM | POA: Diagnosis not present

## 2019-05-12 DIAGNOSIS — E7800 Pure hypercholesterolemia, unspecified: Secondary | ICD-10-CM | POA: Diagnosis not present

## 2019-05-12 DIAGNOSIS — Z789 Other specified health status: Secondary | ICD-10-CM | POA: Diagnosis not present

## 2019-05-14 DIAGNOSIS — L570 Actinic keratosis: Secondary | ICD-10-CM | POA: Diagnosis not present

## 2019-05-14 DIAGNOSIS — R234 Changes in skin texture: Secondary | ICD-10-CM | POA: Diagnosis not present

## 2019-05-14 DIAGNOSIS — L2089 Other atopic dermatitis: Secondary | ICD-10-CM | POA: Diagnosis not present

## 2019-06-03 DIAGNOSIS — Z789 Other specified health status: Secondary | ICD-10-CM | POA: Diagnosis not present

## 2019-06-03 DIAGNOSIS — E7800 Pure hypercholesterolemia, unspecified: Secondary | ICD-10-CM | POA: Diagnosis not present

## 2019-06-03 DIAGNOSIS — I10 Essential (primary) hypertension: Secondary | ICD-10-CM | POA: Diagnosis not present

## 2019-06-17 DIAGNOSIS — M9904 Segmental and somatic dysfunction of sacral region: Secondary | ICD-10-CM | POA: Diagnosis not present

## 2019-06-17 DIAGNOSIS — M5431 Sciatica, right side: Secondary | ICD-10-CM | POA: Diagnosis not present

## 2019-06-17 DIAGNOSIS — M531 Cervicobrachial syndrome: Secondary | ICD-10-CM | POA: Diagnosis not present

## 2019-06-17 DIAGNOSIS — M9903 Segmental and somatic dysfunction of lumbar region: Secondary | ICD-10-CM | POA: Diagnosis not present

## 2019-06-17 DIAGNOSIS — M9905 Segmental and somatic dysfunction of pelvic region: Secondary | ICD-10-CM | POA: Diagnosis not present

## 2019-06-17 DIAGNOSIS — M5432 Sciatica, left side: Secondary | ICD-10-CM | POA: Diagnosis not present

## 2019-06-17 DIAGNOSIS — M5136 Other intervertebral disc degeneration, lumbar region: Secondary | ICD-10-CM | POA: Diagnosis not present

## 2019-06-17 DIAGNOSIS — M9901 Segmental and somatic dysfunction of cervical region: Secondary | ICD-10-CM | POA: Diagnosis not present

## 2019-06-17 DIAGNOSIS — M5416 Radiculopathy, lumbar region: Secondary | ICD-10-CM | POA: Diagnosis not present

## 2019-06-17 DIAGNOSIS — M5031 Other cervical disc degeneration,  high cervical region: Secondary | ICD-10-CM | POA: Diagnosis not present

## 2019-06-19 DIAGNOSIS — M9905 Segmental and somatic dysfunction of pelvic region: Secondary | ICD-10-CM | POA: Diagnosis not present

## 2019-06-19 DIAGNOSIS — M5031 Other cervical disc degeneration,  high cervical region: Secondary | ICD-10-CM | POA: Diagnosis not present

## 2019-06-19 DIAGNOSIS — M5136 Other intervertebral disc degeneration, lumbar region: Secondary | ICD-10-CM | POA: Diagnosis not present

## 2019-06-19 DIAGNOSIS — M9901 Segmental and somatic dysfunction of cervical region: Secondary | ICD-10-CM | POA: Diagnosis not present

## 2019-06-19 DIAGNOSIS — M9903 Segmental and somatic dysfunction of lumbar region: Secondary | ICD-10-CM | POA: Diagnosis not present

## 2019-06-19 DIAGNOSIS — M531 Cervicobrachial syndrome: Secondary | ICD-10-CM | POA: Diagnosis not present

## 2019-06-19 DIAGNOSIS — M5431 Sciatica, right side: Secondary | ICD-10-CM | POA: Diagnosis not present

## 2019-06-19 DIAGNOSIS — M5432 Sciatica, left side: Secondary | ICD-10-CM | POA: Diagnosis not present

## 2019-06-19 DIAGNOSIS — M9904 Segmental and somatic dysfunction of sacral region: Secondary | ICD-10-CM | POA: Diagnosis not present

## 2019-06-20 DIAGNOSIS — M9901 Segmental and somatic dysfunction of cervical region: Secondary | ICD-10-CM | POA: Diagnosis not present

## 2019-06-20 DIAGNOSIS — M9905 Segmental and somatic dysfunction of pelvic region: Secondary | ICD-10-CM | POA: Diagnosis not present

## 2019-06-20 DIAGNOSIS — M5136 Other intervertebral disc degeneration, lumbar region: Secondary | ICD-10-CM | POA: Diagnosis not present

## 2019-06-20 DIAGNOSIS — M5431 Sciatica, right side: Secondary | ICD-10-CM | POA: Diagnosis not present

## 2019-06-20 DIAGNOSIS — M5031 Other cervical disc degeneration,  high cervical region: Secondary | ICD-10-CM | POA: Diagnosis not present

## 2019-06-20 DIAGNOSIS — M531 Cervicobrachial syndrome: Secondary | ICD-10-CM | POA: Diagnosis not present

## 2019-06-20 DIAGNOSIS — M9904 Segmental and somatic dysfunction of sacral region: Secondary | ICD-10-CM | POA: Diagnosis not present

## 2019-06-20 DIAGNOSIS — M9903 Segmental and somatic dysfunction of lumbar region: Secondary | ICD-10-CM | POA: Diagnosis not present

## 2019-06-20 DIAGNOSIS — M5432 Sciatica, left side: Secondary | ICD-10-CM | POA: Diagnosis not present

## 2019-06-23 DIAGNOSIS — M9903 Segmental and somatic dysfunction of lumbar region: Secondary | ICD-10-CM | POA: Diagnosis not present

## 2019-06-23 DIAGNOSIS — M5136 Other intervertebral disc degeneration, lumbar region: Secondary | ICD-10-CM | POA: Diagnosis not present

## 2019-06-23 DIAGNOSIS — M531 Cervicobrachial syndrome: Secondary | ICD-10-CM | POA: Diagnosis not present

## 2019-06-23 DIAGNOSIS — M5031 Other cervical disc degeneration,  high cervical region: Secondary | ICD-10-CM | POA: Diagnosis not present

## 2019-06-23 DIAGNOSIS — M9904 Segmental and somatic dysfunction of sacral region: Secondary | ICD-10-CM | POA: Diagnosis not present

## 2019-06-23 DIAGNOSIS — M9901 Segmental and somatic dysfunction of cervical region: Secondary | ICD-10-CM | POA: Diagnosis not present

## 2019-06-23 DIAGNOSIS — M9905 Segmental and somatic dysfunction of pelvic region: Secondary | ICD-10-CM | POA: Diagnosis not present

## 2019-06-23 DIAGNOSIS — M5431 Sciatica, right side: Secondary | ICD-10-CM | POA: Diagnosis not present

## 2019-06-23 DIAGNOSIS — M5432 Sciatica, left side: Secondary | ICD-10-CM | POA: Diagnosis not present

## 2019-06-25 DIAGNOSIS — M9904 Segmental and somatic dysfunction of sacral region: Secondary | ICD-10-CM | POA: Diagnosis not present

## 2019-06-25 DIAGNOSIS — M5136 Other intervertebral disc degeneration, lumbar region: Secondary | ICD-10-CM | POA: Diagnosis not present

## 2019-06-25 DIAGNOSIS — M9903 Segmental and somatic dysfunction of lumbar region: Secondary | ICD-10-CM | POA: Diagnosis not present

## 2019-06-25 DIAGNOSIS — M5031 Other cervical disc degeneration,  high cervical region: Secondary | ICD-10-CM | POA: Diagnosis not present

## 2019-06-25 DIAGNOSIS — M9901 Segmental and somatic dysfunction of cervical region: Secondary | ICD-10-CM | POA: Diagnosis not present

## 2019-06-25 DIAGNOSIS — M531 Cervicobrachial syndrome: Secondary | ICD-10-CM | POA: Diagnosis not present

## 2019-06-25 DIAGNOSIS — M5432 Sciatica, left side: Secondary | ICD-10-CM | POA: Diagnosis not present

## 2019-06-25 DIAGNOSIS — M9905 Segmental and somatic dysfunction of pelvic region: Secondary | ICD-10-CM | POA: Diagnosis not present

## 2019-06-25 DIAGNOSIS — M5431 Sciatica, right side: Secondary | ICD-10-CM | POA: Diagnosis not present

## 2019-06-26 DIAGNOSIS — M5136 Other intervertebral disc degeneration, lumbar region: Secondary | ICD-10-CM | POA: Diagnosis not present

## 2019-06-26 DIAGNOSIS — M9904 Segmental and somatic dysfunction of sacral region: Secondary | ICD-10-CM | POA: Diagnosis not present

## 2019-06-26 DIAGNOSIS — E7800 Pure hypercholesterolemia, unspecified: Secondary | ICD-10-CM | POA: Diagnosis not present

## 2019-06-26 DIAGNOSIS — M5031 Other cervical disc degeneration,  high cervical region: Secondary | ICD-10-CM | POA: Diagnosis not present

## 2019-06-26 DIAGNOSIS — Z789 Other specified health status: Secondary | ICD-10-CM | POA: Diagnosis not present

## 2019-06-26 DIAGNOSIS — M9905 Segmental and somatic dysfunction of pelvic region: Secondary | ICD-10-CM | POA: Diagnosis not present

## 2019-06-26 DIAGNOSIS — M5432 Sciatica, left side: Secondary | ICD-10-CM | POA: Diagnosis not present

## 2019-06-26 DIAGNOSIS — M9903 Segmental and somatic dysfunction of lumbar region: Secondary | ICD-10-CM | POA: Diagnosis not present

## 2019-06-26 DIAGNOSIS — M531 Cervicobrachial syndrome: Secondary | ICD-10-CM | POA: Diagnosis not present

## 2019-06-26 DIAGNOSIS — I10 Essential (primary) hypertension: Secondary | ICD-10-CM | POA: Diagnosis not present

## 2019-06-26 DIAGNOSIS — M9901 Segmental and somatic dysfunction of cervical region: Secondary | ICD-10-CM | POA: Diagnosis not present

## 2019-06-26 DIAGNOSIS — M5431 Sciatica, right side: Secondary | ICD-10-CM | POA: Diagnosis not present

## 2019-06-30 DIAGNOSIS — M9903 Segmental and somatic dysfunction of lumbar region: Secondary | ICD-10-CM | POA: Diagnosis not present

## 2019-06-30 DIAGNOSIS — M5431 Sciatica, right side: Secondary | ICD-10-CM | POA: Diagnosis not present

## 2019-06-30 DIAGNOSIS — M5432 Sciatica, left side: Secondary | ICD-10-CM | POA: Diagnosis not present

## 2019-06-30 DIAGNOSIS — M9901 Segmental and somatic dysfunction of cervical region: Secondary | ICD-10-CM | POA: Diagnosis not present

## 2019-06-30 DIAGNOSIS — M9905 Segmental and somatic dysfunction of pelvic region: Secondary | ICD-10-CM | POA: Diagnosis not present

## 2019-06-30 DIAGNOSIS — M9904 Segmental and somatic dysfunction of sacral region: Secondary | ICD-10-CM | POA: Diagnosis not present

## 2019-06-30 DIAGNOSIS — M5031 Other cervical disc degeneration,  high cervical region: Secondary | ICD-10-CM | POA: Diagnosis not present

## 2019-06-30 DIAGNOSIS — M531 Cervicobrachial syndrome: Secondary | ICD-10-CM | POA: Diagnosis not present

## 2019-06-30 DIAGNOSIS — M5136 Other intervertebral disc degeneration, lumbar region: Secondary | ICD-10-CM | POA: Diagnosis not present

## 2019-07-01 DIAGNOSIS — M5431 Sciatica, right side: Secondary | ICD-10-CM | POA: Diagnosis not present

## 2019-07-01 DIAGNOSIS — M5432 Sciatica, left side: Secondary | ICD-10-CM | POA: Diagnosis not present

## 2019-07-01 DIAGNOSIS — M531 Cervicobrachial syndrome: Secondary | ICD-10-CM | POA: Diagnosis not present

## 2019-07-01 DIAGNOSIS — M5136 Other intervertebral disc degeneration, lumbar region: Secondary | ICD-10-CM | POA: Diagnosis not present

## 2019-07-01 DIAGNOSIS — M9901 Segmental and somatic dysfunction of cervical region: Secondary | ICD-10-CM | POA: Diagnosis not present

## 2019-07-01 DIAGNOSIS — M9905 Segmental and somatic dysfunction of pelvic region: Secondary | ICD-10-CM | POA: Diagnosis not present

## 2019-07-01 DIAGNOSIS — M9903 Segmental and somatic dysfunction of lumbar region: Secondary | ICD-10-CM | POA: Diagnosis not present

## 2019-07-01 DIAGNOSIS — M5031 Other cervical disc degeneration,  high cervical region: Secondary | ICD-10-CM | POA: Diagnosis not present

## 2019-07-01 DIAGNOSIS — M9904 Segmental and somatic dysfunction of sacral region: Secondary | ICD-10-CM | POA: Diagnosis not present

## 2019-07-03 DIAGNOSIS — M9905 Segmental and somatic dysfunction of pelvic region: Secondary | ICD-10-CM | POA: Diagnosis not present

## 2019-07-03 DIAGNOSIS — M531 Cervicobrachial syndrome: Secondary | ICD-10-CM | POA: Diagnosis not present

## 2019-07-03 DIAGNOSIS — M9903 Segmental and somatic dysfunction of lumbar region: Secondary | ICD-10-CM | POA: Diagnosis not present

## 2019-07-03 DIAGNOSIS — M5031 Other cervical disc degeneration,  high cervical region: Secondary | ICD-10-CM | POA: Diagnosis not present

## 2019-07-03 DIAGNOSIS — M9904 Segmental and somatic dysfunction of sacral region: Secondary | ICD-10-CM | POA: Diagnosis not present

## 2019-07-03 DIAGNOSIS — M9901 Segmental and somatic dysfunction of cervical region: Secondary | ICD-10-CM | POA: Diagnosis not present

## 2019-07-03 DIAGNOSIS — M5431 Sciatica, right side: Secondary | ICD-10-CM | POA: Diagnosis not present

## 2019-07-03 DIAGNOSIS — M5432 Sciatica, left side: Secondary | ICD-10-CM | POA: Diagnosis not present

## 2019-07-03 DIAGNOSIS — M5136 Other intervertebral disc degeneration, lumbar region: Secondary | ICD-10-CM | POA: Diagnosis not present

## 2019-07-04 DIAGNOSIS — R5383 Other fatigue: Secondary | ICD-10-CM | POA: Diagnosis not present

## 2019-07-04 DIAGNOSIS — Z1339 Encounter for screening examination for other mental health and behavioral disorders: Secondary | ICD-10-CM | POA: Diagnosis not present

## 2019-07-04 DIAGNOSIS — Z79899 Other long term (current) drug therapy: Secondary | ICD-10-CM | POA: Diagnosis not present

## 2019-07-04 DIAGNOSIS — Z1159 Encounter for screening for other viral diseases: Secondary | ICD-10-CM | POA: Diagnosis not present

## 2019-07-04 DIAGNOSIS — Z13220 Encounter for screening for lipoid disorders: Secondary | ICD-10-CM | POA: Diagnosis not present

## 2019-07-04 DIAGNOSIS — Z114 Encounter for screening for human immunodeficiency virus [HIV]: Secondary | ICD-10-CM | POA: Diagnosis not present

## 2019-07-04 DIAGNOSIS — R0602 Shortness of breath: Secondary | ICD-10-CM | POA: Diagnosis not present

## 2019-07-04 DIAGNOSIS — E559 Vitamin D deficiency, unspecified: Secondary | ICD-10-CM | POA: Diagnosis not present

## 2019-07-04 DIAGNOSIS — Z1331 Encounter for screening for depression: Secondary | ICD-10-CM | POA: Diagnosis not present

## 2019-07-04 DIAGNOSIS — Z131 Encounter for screening for diabetes mellitus: Secondary | ICD-10-CM | POA: Diagnosis not present

## 2019-07-04 DIAGNOSIS — E538 Deficiency of other specified B group vitamins: Secondary | ICD-10-CM | POA: Diagnosis not present

## 2019-07-07 DIAGNOSIS — M9904 Segmental and somatic dysfunction of sacral region: Secondary | ICD-10-CM | POA: Diagnosis not present

## 2019-07-07 DIAGNOSIS — M5431 Sciatica, right side: Secondary | ICD-10-CM | POA: Diagnosis not present

## 2019-07-07 DIAGNOSIS — M531 Cervicobrachial syndrome: Secondary | ICD-10-CM | POA: Diagnosis not present

## 2019-07-07 DIAGNOSIS — M9901 Segmental and somatic dysfunction of cervical region: Secondary | ICD-10-CM | POA: Diagnosis not present

## 2019-07-07 DIAGNOSIS — M9905 Segmental and somatic dysfunction of pelvic region: Secondary | ICD-10-CM | POA: Diagnosis not present

## 2019-07-07 DIAGNOSIS — M5136 Other intervertebral disc degeneration, lumbar region: Secondary | ICD-10-CM | POA: Diagnosis not present

## 2019-07-07 DIAGNOSIS — M5031 Other cervical disc degeneration,  high cervical region: Secondary | ICD-10-CM | POA: Diagnosis not present

## 2019-07-07 DIAGNOSIS — M5432 Sciatica, left side: Secondary | ICD-10-CM | POA: Diagnosis not present

## 2019-07-07 DIAGNOSIS — M9903 Segmental and somatic dysfunction of lumbar region: Secondary | ICD-10-CM | POA: Diagnosis not present

## 2019-07-08 DIAGNOSIS — M9905 Segmental and somatic dysfunction of pelvic region: Secondary | ICD-10-CM | POA: Diagnosis not present

## 2019-07-08 DIAGNOSIS — M5431 Sciatica, right side: Secondary | ICD-10-CM | POA: Diagnosis not present

## 2019-07-08 DIAGNOSIS — M9904 Segmental and somatic dysfunction of sacral region: Secondary | ICD-10-CM | POA: Diagnosis not present

## 2019-07-08 DIAGNOSIS — M5136 Other intervertebral disc degeneration, lumbar region: Secondary | ICD-10-CM | POA: Diagnosis not present

## 2019-07-08 DIAGNOSIS — M9901 Segmental and somatic dysfunction of cervical region: Secondary | ICD-10-CM | POA: Diagnosis not present

## 2019-07-08 DIAGNOSIS — M9903 Segmental and somatic dysfunction of lumbar region: Secondary | ICD-10-CM | POA: Diagnosis not present

## 2019-07-08 DIAGNOSIS — M5432 Sciatica, left side: Secondary | ICD-10-CM | POA: Diagnosis not present

## 2019-07-08 DIAGNOSIS — M531 Cervicobrachial syndrome: Secondary | ICD-10-CM | POA: Diagnosis not present

## 2019-07-08 DIAGNOSIS — M5031 Other cervical disc degeneration,  high cervical region: Secondary | ICD-10-CM | POA: Diagnosis not present

## 2019-07-10 DIAGNOSIS — G8929 Other chronic pain: Secondary | ICD-10-CM | POA: Diagnosis not present

## 2019-07-10 DIAGNOSIS — M542 Cervicalgia: Secondary | ICD-10-CM | POA: Diagnosis not present

## 2019-07-10 DIAGNOSIS — Z79891 Long term (current) use of opiate analgesic: Secondary | ICD-10-CM | POA: Diagnosis not present

## 2019-07-10 DIAGNOSIS — M5416 Radiculopathy, lumbar region: Secondary | ICD-10-CM | POA: Diagnosis not present

## 2019-07-10 DIAGNOSIS — Z1159 Encounter for screening for other viral diseases: Secondary | ICD-10-CM | POA: Diagnosis not present

## 2019-07-14 DIAGNOSIS — M9904 Segmental and somatic dysfunction of sacral region: Secondary | ICD-10-CM | POA: Diagnosis not present

## 2019-07-14 DIAGNOSIS — M5136 Other intervertebral disc degeneration, lumbar region: Secondary | ICD-10-CM | POA: Diagnosis not present

## 2019-07-14 DIAGNOSIS — M5031 Other cervical disc degeneration,  high cervical region: Secondary | ICD-10-CM | POA: Diagnosis not present

## 2019-07-14 DIAGNOSIS — M531 Cervicobrachial syndrome: Secondary | ICD-10-CM | POA: Diagnosis not present

## 2019-07-14 DIAGNOSIS — M5432 Sciatica, left side: Secondary | ICD-10-CM | POA: Diagnosis not present

## 2019-07-14 DIAGNOSIS — M9901 Segmental and somatic dysfunction of cervical region: Secondary | ICD-10-CM | POA: Diagnosis not present

## 2019-07-14 DIAGNOSIS — M9905 Segmental and somatic dysfunction of pelvic region: Secondary | ICD-10-CM | POA: Diagnosis not present

## 2019-07-14 DIAGNOSIS — M9903 Segmental and somatic dysfunction of lumbar region: Secondary | ICD-10-CM | POA: Diagnosis not present

## 2019-07-14 DIAGNOSIS — M5431 Sciatica, right side: Secondary | ICD-10-CM | POA: Diagnosis not present

## 2019-07-21 DIAGNOSIS — M9905 Segmental and somatic dysfunction of pelvic region: Secondary | ICD-10-CM | POA: Diagnosis not present

## 2019-07-21 DIAGNOSIS — M5432 Sciatica, left side: Secondary | ICD-10-CM | POA: Diagnosis not present

## 2019-07-21 DIAGNOSIS — M9903 Segmental and somatic dysfunction of lumbar region: Secondary | ICD-10-CM | POA: Diagnosis not present

## 2019-07-21 DIAGNOSIS — M9904 Segmental and somatic dysfunction of sacral region: Secondary | ICD-10-CM | POA: Diagnosis not present

## 2019-07-21 DIAGNOSIS — M531 Cervicobrachial syndrome: Secondary | ICD-10-CM | POA: Diagnosis not present

## 2019-07-21 DIAGNOSIS — M9901 Segmental and somatic dysfunction of cervical region: Secondary | ICD-10-CM | POA: Diagnosis not present

## 2019-07-21 DIAGNOSIS — M5031 Other cervical disc degeneration,  high cervical region: Secondary | ICD-10-CM | POA: Diagnosis not present

## 2019-07-21 DIAGNOSIS — M5136 Other intervertebral disc degeneration, lumbar region: Secondary | ICD-10-CM | POA: Diagnosis not present

## 2019-07-21 DIAGNOSIS — M5431 Sciatica, right side: Secondary | ICD-10-CM | POA: Diagnosis not present

## 2019-07-28 DIAGNOSIS — M9904 Segmental and somatic dysfunction of sacral region: Secondary | ICD-10-CM | POA: Diagnosis not present

## 2019-07-28 DIAGNOSIS — M9901 Segmental and somatic dysfunction of cervical region: Secondary | ICD-10-CM | POA: Diagnosis not present

## 2019-07-28 DIAGNOSIS — M9905 Segmental and somatic dysfunction of pelvic region: Secondary | ICD-10-CM | POA: Diagnosis not present

## 2019-07-28 DIAGNOSIS — M5432 Sciatica, left side: Secondary | ICD-10-CM | POA: Diagnosis not present

## 2019-07-28 DIAGNOSIS — M5031 Other cervical disc degeneration,  high cervical region: Secondary | ICD-10-CM | POA: Diagnosis not present

## 2019-07-28 DIAGNOSIS — M9903 Segmental and somatic dysfunction of lumbar region: Secondary | ICD-10-CM | POA: Diagnosis not present

## 2019-07-28 DIAGNOSIS — M5431 Sciatica, right side: Secondary | ICD-10-CM | POA: Diagnosis not present

## 2019-07-28 DIAGNOSIS — M5136 Other intervertebral disc degeneration, lumbar region: Secondary | ICD-10-CM | POA: Diagnosis not present

## 2019-07-28 DIAGNOSIS — M531 Cervicobrachial syndrome: Secondary | ICD-10-CM | POA: Diagnosis not present

## 2019-07-31 DIAGNOSIS — M9904 Segmental and somatic dysfunction of sacral region: Secondary | ICD-10-CM | POA: Diagnosis not present

## 2019-07-31 DIAGNOSIS — M9901 Segmental and somatic dysfunction of cervical region: Secondary | ICD-10-CM | POA: Diagnosis not present

## 2019-07-31 DIAGNOSIS — M9905 Segmental and somatic dysfunction of pelvic region: Secondary | ICD-10-CM | POA: Diagnosis not present

## 2019-07-31 DIAGNOSIS — M5031 Other cervical disc degeneration,  high cervical region: Secondary | ICD-10-CM | POA: Diagnosis not present

## 2019-07-31 DIAGNOSIS — M5432 Sciatica, left side: Secondary | ICD-10-CM | POA: Diagnosis not present

## 2019-07-31 DIAGNOSIS — M9903 Segmental and somatic dysfunction of lumbar region: Secondary | ICD-10-CM | POA: Diagnosis not present

## 2019-07-31 DIAGNOSIS — M5431 Sciatica, right side: Secondary | ICD-10-CM | POA: Diagnosis not present

## 2019-07-31 DIAGNOSIS — M5136 Other intervertebral disc degeneration, lumbar region: Secondary | ICD-10-CM | POA: Diagnosis not present

## 2019-07-31 DIAGNOSIS — M531 Cervicobrachial syndrome: Secondary | ICD-10-CM | POA: Diagnosis not present

## 2019-08-04 DIAGNOSIS — M5432 Sciatica, left side: Secondary | ICD-10-CM | POA: Diagnosis not present

## 2019-08-04 DIAGNOSIS — M9901 Segmental and somatic dysfunction of cervical region: Secondary | ICD-10-CM | POA: Diagnosis not present

## 2019-08-04 DIAGNOSIS — M5136 Other intervertebral disc degeneration, lumbar region: Secondary | ICD-10-CM | POA: Diagnosis not present

## 2019-08-04 DIAGNOSIS — M9904 Segmental and somatic dysfunction of sacral region: Secondary | ICD-10-CM | POA: Diagnosis not present

## 2019-08-04 DIAGNOSIS — M9905 Segmental and somatic dysfunction of pelvic region: Secondary | ICD-10-CM | POA: Diagnosis not present

## 2019-08-04 DIAGNOSIS — M9903 Segmental and somatic dysfunction of lumbar region: Secondary | ICD-10-CM | POA: Diagnosis not present

## 2019-08-04 DIAGNOSIS — M5431 Sciatica, right side: Secondary | ICD-10-CM | POA: Diagnosis not present

## 2019-08-04 DIAGNOSIS — M531 Cervicobrachial syndrome: Secondary | ICD-10-CM | POA: Diagnosis not present

## 2019-08-04 DIAGNOSIS — M5031 Other cervical disc degeneration,  high cervical region: Secondary | ICD-10-CM | POA: Diagnosis not present

## 2019-08-07 DIAGNOSIS — M9905 Segmental and somatic dysfunction of pelvic region: Secondary | ICD-10-CM | POA: Diagnosis not present

## 2019-08-07 DIAGNOSIS — M9903 Segmental and somatic dysfunction of lumbar region: Secondary | ICD-10-CM | POA: Diagnosis not present

## 2019-08-07 DIAGNOSIS — M9901 Segmental and somatic dysfunction of cervical region: Secondary | ICD-10-CM | POA: Diagnosis not present

## 2019-08-07 DIAGNOSIS — M5432 Sciatica, left side: Secondary | ICD-10-CM | POA: Diagnosis not present

## 2019-08-07 DIAGNOSIS — M5431 Sciatica, right side: Secondary | ICD-10-CM | POA: Diagnosis not present

## 2019-08-07 DIAGNOSIS — M531 Cervicobrachial syndrome: Secondary | ICD-10-CM | POA: Diagnosis not present

## 2019-08-07 DIAGNOSIS — M5136 Other intervertebral disc degeneration, lumbar region: Secondary | ICD-10-CM | POA: Diagnosis not present

## 2019-08-07 DIAGNOSIS — M9904 Segmental and somatic dysfunction of sacral region: Secondary | ICD-10-CM | POA: Diagnosis not present

## 2019-08-07 DIAGNOSIS — M5031 Other cervical disc degeneration,  high cervical region: Secondary | ICD-10-CM | POA: Diagnosis not present

## 2019-08-11 DIAGNOSIS — M9905 Segmental and somatic dysfunction of pelvic region: Secondary | ICD-10-CM | POA: Diagnosis not present

## 2019-08-11 DIAGNOSIS — M5431 Sciatica, right side: Secondary | ICD-10-CM | POA: Diagnosis not present

## 2019-08-11 DIAGNOSIS — M5432 Sciatica, left side: Secondary | ICD-10-CM | POA: Diagnosis not present

## 2019-08-11 DIAGNOSIS — M5031 Other cervical disc degeneration,  high cervical region: Secondary | ICD-10-CM | POA: Diagnosis not present

## 2019-08-11 DIAGNOSIS — M9904 Segmental and somatic dysfunction of sacral region: Secondary | ICD-10-CM | POA: Diagnosis not present

## 2019-08-11 DIAGNOSIS — M9901 Segmental and somatic dysfunction of cervical region: Secondary | ICD-10-CM | POA: Diagnosis not present

## 2019-08-11 DIAGNOSIS — M531 Cervicobrachial syndrome: Secondary | ICD-10-CM | POA: Diagnosis not present

## 2019-08-11 DIAGNOSIS — M9903 Segmental and somatic dysfunction of lumbar region: Secondary | ICD-10-CM | POA: Diagnosis not present

## 2019-08-11 DIAGNOSIS — M5136 Other intervertebral disc degeneration, lumbar region: Secondary | ICD-10-CM | POA: Diagnosis not present

## 2019-08-18 DIAGNOSIS — M9903 Segmental and somatic dysfunction of lumbar region: Secondary | ICD-10-CM | POA: Diagnosis not present

## 2019-08-18 DIAGNOSIS — I10 Essential (primary) hypertension: Secondary | ICD-10-CM | POA: Diagnosis not present

## 2019-08-18 DIAGNOSIS — Z789 Other specified health status: Secondary | ICD-10-CM | POA: Diagnosis not present

## 2019-08-18 DIAGNOSIS — M5136 Other intervertebral disc degeneration, lumbar region: Secondary | ICD-10-CM | POA: Diagnosis not present

## 2019-08-18 DIAGNOSIS — M5031 Other cervical disc degeneration,  high cervical region: Secondary | ICD-10-CM | POA: Diagnosis not present

## 2019-08-18 DIAGNOSIS — M9905 Segmental and somatic dysfunction of pelvic region: Secondary | ICD-10-CM | POA: Diagnosis not present

## 2019-08-18 DIAGNOSIS — M9904 Segmental and somatic dysfunction of sacral region: Secondary | ICD-10-CM | POA: Diagnosis not present

## 2019-08-18 DIAGNOSIS — M5432 Sciatica, left side: Secondary | ICD-10-CM | POA: Diagnosis not present

## 2019-08-18 DIAGNOSIS — E7800 Pure hypercholesterolemia, unspecified: Secondary | ICD-10-CM | POA: Diagnosis not present

## 2019-08-18 DIAGNOSIS — M531 Cervicobrachial syndrome: Secondary | ICD-10-CM | POA: Diagnosis not present

## 2019-08-18 DIAGNOSIS — M5431 Sciatica, right side: Secondary | ICD-10-CM | POA: Diagnosis not present

## 2019-08-18 DIAGNOSIS — M9901 Segmental and somatic dysfunction of cervical region: Secondary | ICD-10-CM | POA: Diagnosis not present

## 2019-09-02 DIAGNOSIS — Z789 Other specified health status: Secondary | ICD-10-CM | POA: Diagnosis not present

## 2019-09-02 DIAGNOSIS — E7800 Pure hypercholesterolemia, unspecified: Secondary | ICD-10-CM | POA: Diagnosis not present

## 2019-09-02 DIAGNOSIS — I10 Essential (primary) hypertension: Secondary | ICD-10-CM | POA: Diagnosis not present

## 2019-09-29 DIAGNOSIS — G8929 Other chronic pain: Secondary | ICD-10-CM | POA: Diagnosis not present

## 2019-09-29 DIAGNOSIS — M5416 Radiculopathy, lumbar region: Secondary | ICD-10-CM | POA: Diagnosis not present

## 2019-09-29 DIAGNOSIS — M542 Cervicalgia: Secondary | ICD-10-CM | POA: Diagnosis not present

## 2019-09-29 DIAGNOSIS — E538 Deficiency of other specified B group vitamins: Secondary | ICD-10-CM | POA: Diagnosis not present

## 2019-09-30 DIAGNOSIS — Z789 Other specified health status: Secondary | ICD-10-CM | POA: Diagnosis not present

## 2019-09-30 DIAGNOSIS — I10 Essential (primary) hypertension: Secondary | ICD-10-CM | POA: Diagnosis not present

## 2019-09-30 DIAGNOSIS — E7800 Pure hypercholesterolemia, unspecified: Secondary | ICD-10-CM | POA: Diagnosis not present

## 2019-11-18 DIAGNOSIS — M9904 Segmental and somatic dysfunction of sacral region: Secondary | ICD-10-CM | POA: Diagnosis not present

## 2019-11-18 DIAGNOSIS — M5431 Sciatica, right side: Secondary | ICD-10-CM | POA: Diagnosis not present

## 2019-11-18 DIAGNOSIS — M5432 Sciatica, left side: Secondary | ICD-10-CM | POA: Diagnosis not present

## 2019-11-18 DIAGNOSIS — M531 Cervicobrachial syndrome: Secondary | ICD-10-CM | POA: Diagnosis not present

## 2019-11-18 DIAGNOSIS — M9905 Segmental and somatic dysfunction of pelvic region: Secondary | ICD-10-CM | POA: Diagnosis not present

## 2019-11-18 DIAGNOSIS — M5136 Other intervertebral disc degeneration, lumbar region: Secondary | ICD-10-CM | POA: Diagnosis not present

## 2019-11-18 DIAGNOSIS — M9901 Segmental and somatic dysfunction of cervical region: Secondary | ICD-10-CM | POA: Diagnosis not present

## 2019-11-18 DIAGNOSIS — M9903 Segmental and somatic dysfunction of lumbar region: Secondary | ICD-10-CM | POA: Diagnosis not present

## 2019-11-18 DIAGNOSIS — M5031 Other cervical disc degeneration,  high cervical region: Secondary | ICD-10-CM | POA: Diagnosis not present

## 2019-11-20 DIAGNOSIS — M5432 Sciatica, left side: Secondary | ICD-10-CM | POA: Diagnosis not present

## 2019-11-20 DIAGNOSIS — M5031 Other cervical disc degeneration,  high cervical region: Secondary | ICD-10-CM | POA: Diagnosis not present

## 2019-11-20 DIAGNOSIS — M5431 Sciatica, right side: Secondary | ICD-10-CM | POA: Diagnosis not present

## 2019-11-20 DIAGNOSIS — M9904 Segmental and somatic dysfunction of sacral region: Secondary | ICD-10-CM | POA: Diagnosis not present

## 2019-11-20 DIAGNOSIS — M9901 Segmental and somatic dysfunction of cervical region: Secondary | ICD-10-CM | POA: Diagnosis not present

## 2019-11-20 DIAGNOSIS — M5136 Other intervertebral disc degeneration, lumbar region: Secondary | ICD-10-CM | POA: Diagnosis not present

## 2019-11-20 DIAGNOSIS — M531 Cervicobrachial syndrome: Secondary | ICD-10-CM | POA: Diagnosis not present

## 2019-11-20 DIAGNOSIS — M9905 Segmental and somatic dysfunction of pelvic region: Secondary | ICD-10-CM | POA: Diagnosis not present

## 2019-11-20 DIAGNOSIS — M9903 Segmental and somatic dysfunction of lumbar region: Secondary | ICD-10-CM | POA: Diagnosis not present

## 2019-11-24 DIAGNOSIS — M5431 Sciatica, right side: Secondary | ICD-10-CM | POA: Diagnosis not present

## 2019-11-24 DIAGNOSIS — M5031 Other cervical disc degeneration,  high cervical region: Secondary | ICD-10-CM | POA: Diagnosis not present

## 2019-11-24 DIAGNOSIS — M9904 Segmental and somatic dysfunction of sacral region: Secondary | ICD-10-CM | POA: Diagnosis not present

## 2019-11-24 DIAGNOSIS — M9903 Segmental and somatic dysfunction of lumbar region: Secondary | ICD-10-CM | POA: Diagnosis not present

## 2019-11-24 DIAGNOSIS — M5136 Other intervertebral disc degeneration, lumbar region: Secondary | ICD-10-CM | POA: Diagnosis not present

## 2019-11-24 DIAGNOSIS — M9901 Segmental and somatic dysfunction of cervical region: Secondary | ICD-10-CM | POA: Diagnosis not present

## 2019-11-24 DIAGNOSIS — M9905 Segmental and somatic dysfunction of pelvic region: Secondary | ICD-10-CM | POA: Diagnosis not present

## 2019-11-24 DIAGNOSIS — M5432 Sciatica, left side: Secondary | ICD-10-CM | POA: Diagnosis not present

## 2019-11-24 DIAGNOSIS — M531 Cervicobrachial syndrome: Secondary | ICD-10-CM | POA: Diagnosis not present

## 2019-11-26 DIAGNOSIS — M5031 Other cervical disc degeneration,  high cervical region: Secondary | ICD-10-CM | POA: Diagnosis not present

## 2019-11-26 DIAGNOSIS — M9905 Segmental and somatic dysfunction of pelvic region: Secondary | ICD-10-CM | POA: Diagnosis not present

## 2019-11-26 DIAGNOSIS — M5431 Sciatica, right side: Secondary | ICD-10-CM | POA: Diagnosis not present

## 2019-11-26 DIAGNOSIS — M5432 Sciatica, left side: Secondary | ICD-10-CM | POA: Diagnosis not present

## 2019-11-26 DIAGNOSIS — M9901 Segmental and somatic dysfunction of cervical region: Secondary | ICD-10-CM | POA: Diagnosis not present

## 2019-11-26 DIAGNOSIS — M9904 Segmental and somatic dysfunction of sacral region: Secondary | ICD-10-CM | POA: Diagnosis not present

## 2019-11-26 DIAGNOSIS — M531 Cervicobrachial syndrome: Secondary | ICD-10-CM | POA: Diagnosis not present

## 2019-11-26 DIAGNOSIS — M5136 Other intervertebral disc degeneration, lumbar region: Secondary | ICD-10-CM | POA: Diagnosis not present

## 2019-11-26 DIAGNOSIS — M9903 Segmental and somatic dysfunction of lumbar region: Secondary | ICD-10-CM | POA: Diagnosis not present

## 2019-11-27 DIAGNOSIS — G8929 Other chronic pain: Secondary | ICD-10-CM | POA: Diagnosis not present

## 2019-11-27 DIAGNOSIS — M542 Cervicalgia: Secondary | ICD-10-CM | POA: Diagnosis not present

## 2019-11-27 DIAGNOSIS — Z79899 Other long term (current) drug therapy: Secondary | ICD-10-CM | POA: Diagnosis not present

## 2019-11-27 DIAGNOSIS — M5416 Radiculopathy, lumbar region: Secondary | ICD-10-CM | POA: Diagnosis not present

## 2019-11-27 DIAGNOSIS — E538 Deficiency of other specified B group vitamins: Secondary | ICD-10-CM | POA: Diagnosis not present

## 2019-11-27 DIAGNOSIS — M545 Low back pain: Secondary | ICD-10-CM | POA: Diagnosis not present

## 2019-12-01 DIAGNOSIS — M5431 Sciatica, right side: Secondary | ICD-10-CM | POA: Diagnosis not present

## 2019-12-01 DIAGNOSIS — M5432 Sciatica, left side: Secondary | ICD-10-CM | POA: Diagnosis not present

## 2019-12-01 DIAGNOSIS — M9903 Segmental and somatic dysfunction of lumbar region: Secondary | ICD-10-CM | POA: Diagnosis not present

## 2019-12-01 DIAGNOSIS — M9901 Segmental and somatic dysfunction of cervical region: Secondary | ICD-10-CM | POA: Diagnosis not present

## 2019-12-01 DIAGNOSIS — M9905 Segmental and somatic dysfunction of pelvic region: Secondary | ICD-10-CM | POA: Diagnosis not present

## 2019-12-01 DIAGNOSIS — M531 Cervicobrachial syndrome: Secondary | ICD-10-CM | POA: Diagnosis not present

## 2019-12-01 DIAGNOSIS — M5031 Other cervical disc degeneration,  high cervical region: Secondary | ICD-10-CM | POA: Diagnosis not present

## 2019-12-01 DIAGNOSIS — M5136 Other intervertebral disc degeneration, lumbar region: Secondary | ICD-10-CM | POA: Diagnosis not present

## 2019-12-01 DIAGNOSIS — M9904 Segmental and somatic dysfunction of sacral region: Secondary | ICD-10-CM | POA: Diagnosis not present

## 2019-12-04 DIAGNOSIS — M5136 Other intervertebral disc degeneration, lumbar region: Secondary | ICD-10-CM | POA: Diagnosis not present

## 2019-12-04 DIAGNOSIS — M9905 Segmental and somatic dysfunction of pelvic region: Secondary | ICD-10-CM | POA: Diagnosis not present

## 2019-12-04 DIAGNOSIS — M531 Cervicobrachial syndrome: Secondary | ICD-10-CM | POA: Diagnosis not present

## 2019-12-04 DIAGNOSIS — M9901 Segmental and somatic dysfunction of cervical region: Secondary | ICD-10-CM | POA: Diagnosis not present

## 2019-12-04 DIAGNOSIS — M5031 Other cervical disc degeneration,  high cervical region: Secondary | ICD-10-CM | POA: Diagnosis not present

## 2019-12-04 DIAGNOSIS — M9904 Segmental and somatic dysfunction of sacral region: Secondary | ICD-10-CM | POA: Diagnosis not present

## 2019-12-04 DIAGNOSIS — M5432 Sciatica, left side: Secondary | ICD-10-CM | POA: Diagnosis not present

## 2019-12-04 DIAGNOSIS — M5431 Sciatica, right side: Secondary | ICD-10-CM | POA: Diagnosis not present

## 2019-12-04 DIAGNOSIS — M9903 Segmental and somatic dysfunction of lumbar region: Secondary | ICD-10-CM | POA: Diagnosis not present

## 2019-12-05 DIAGNOSIS — Z789 Other specified health status: Secondary | ICD-10-CM | POA: Diagnosis not present

## 2019-12-05 DIAGNOSIS — E7800 Pure hypercholesterolemia, unspecified: Secondary | ICD-10-CM | POA: Diagnosis not present

## 2019-12-05 DIAGNOSIS — I10 Essential (primary) hypertension: Secondary | ICD-10-CM | POA: Diagnosis not present

## 2019-12-08 DIAGNOSIS — M545 Low back pain: Secondary | ICD-10-CM | POA: Diagnosis not present

## 2019-12-08 DIAGNOSIS — M4726 Other spondylosis with radiculopathy, lumbar region: Secondary | ICD-10-CM | POA: Diagnosis not present

## 2019-12-08 DIAGNOSIS — M419 Scoliosis, unspecified: Secondary | ICD-10-CM | POA: Diagnosis not present

## 2019-12-08 DIAGNOSIS — M5136 Other intervertebral disc degeneration, lumbar region: Secondary | ICD-10-CM | POA: Diagnosis not present

## 2019-12-11 DIAGNOSIS — M5136 Other intervertebral disc degeneration, lumbar region: Secondary | ICD-10-CM | POA: Diagnosis not present

## 2019-12-11 DIAGNOSIS — M9903 Segmental and somatic dysfunction of lumbar region: Secondary | ICD-10-CM | POA: Diagnosis not present

## 2019-12-11 DIAGNOSIS — M5432 Sciatica, left side: Secondary | ICD-10-CM | POA: Diagnosis not present

## 2019-12-11 DIAGNOSIS — M9905 Segmental and somatic dysfunction of pelvic region: Secondary | ICD-10-CM | POA: Diagnosis not present

## 2019-12-11 DIAGNOSIS — M531 Cervicobrachial syndrome: Secondary | ICD-10-CM | POA: Diagnosis not present

## 2019-12-11 DIAGNOSIS — M9901 Segmental and somatic dysfunction of cervical region: Secondary | ICD-10-CM | POA: Diagnosis not present

## 2019-12-11 DIAGNOSIS — M5431 Sciatica, right side: Secondary | ICD-10-CM | POA: Diagnosis not present

## 2019-12-11 DIAGNOSIS — M5031 Other cervical disc degeneration,  high cervical region: Secondary | ICD-10-CM | POA: Diagnosis not present

## 2019-12-11 DIAGNOSIS — M9904 Segmental and somatic dysfunction of sacral region: Secondary | ICD-10-CM | POA: Diagnosis not present

## 2019-12-15 DIAGNOSIS — M9905 Segmental and somatic dysfunction of pelvic region: Secondary | ICD-10-CM | POA: Diagnosis not present

## 2019-12-15 DIAGNOSIS — M9903 Segmental and somatic dysfunction of lumbar region: Secondary | ICD-10-CM | POA: Diagnosis not present

## 2019-12-15 DIAGNOSIS — M531 Cervicobrachial syndrome: Secondary | ICD-10-CM | POA: Diagnosis not present

## 2019-12-15 DIAGNOSIS — M5431 Sciatica, right side: Secondary | ICD-10-CM | POA: Diagnosis not present

## 2019-12-15 DIAGNOSIS — M9904 Segmental and somatic dysfunction of sacral region: Secondary | ICD-10-CM | POA: Diagnosis not present

## 2019-12-15 DIAGNOSIS — M5432 Sciatica, left side: Secondary | ICD-10-CM | POA: Diagnosis not present

## 2019-12-15 DIAGNOSIS — M5031 Other cervical disc degeneration,  high cervical region: Secondary | ICD-10-CM | POA: Diagnosis not present

## 2019-12-15 DIAGNOSIS — M5136 Other intervertebral disc degeneration, lumbar region: Secondary | ICD-10-CM | POA: Diagnosis not present

## 2019-12-15 DIAGNOSIS — M9901 Segmental and somatic dysfunction of cervical region: Secondary | ICD-10-CM | POA: Diagnosis not present

## 2019-12-24 DIAGNOSIS — M9905 Segmental and somatic dysfunction of pelvic region: Secondary | ICD-10-CM | POA: Diagnosis not present

## 2019-12-24 DIAGNOSIS — M5136 Other intervertebral disc degeneration, lumbar region: Secondary | ICD-10-CM | POA: Diagnosis not present

## 2019-12-24 DIAGNOSIS — M9901 Segmental and somatic dysfunction of cervical region: Secondary | ICD-10-CM | POA: Diagnosis not present

## 2019-12-24 DIAGNOSIS — M5432 Sciatica, left side: Secondary | ICD-10-CM | POA: Diagnosis not present

## 2019-12-24 DIAGNOSIS — M9903 Segmental and somatic dysfunction of lumbar region: Secondary | ICD-10-CM | POA: Diagnosis not present

## 2019-12-24 DIAGNOSIS — M5431 Sciatica, right side: Secondary | ICD-10-CM | POA: Diagnosis not present

## 2019-12-24 DIAGNOSIS — M531 Cervicobrachial syndrome: Secondary | ICD-10-CM | POA: Diagnosis not present

## 2019-12-24 DIAGNOSIS — M5031 Other cervical disc degeneration,  high cervical region: Secondary | ICD-10-CM | POA: Diagnosis not present

## 2019-12-24 DIAGNOSIS — M9904 Segmental and somatic dysfunction of sacral region: Secondary | ICD-10-CM | POA: Diagnosis not present

## 2019-12-25 DIAGNOSIS — N898 Other specified noninflammatory disorders of vagina: Secondary | ICD-10-CM | POA: Diagnosis not present

## 2019-12-25 DIAGNOSIS — R32 Unspecified urinary incontinence: Secondary | ICD-10-CM | POA: Diagnosis not present

## 2019-12-25 DIAGNOSIS — R151 Fecal smearing: Secondary | ICD-10-CM | POA: Diagnosis not present

## 2019-12-31 DIAGNOSIS — E7800 Pure hypercholesterolemia, unspecified: Secondary | ICD-10-CM | POA: Diagnosis not present

## 2019-12-31 DIAGNOSIS — I10 Essential (primary) hypertension: Secondary | ICD-10-CM | POA: Diagnosis not present

## 2019-12-31 DIAGNOSIS — Z789 Other specified health status: Secondary | ICD-10-CM | POA: Diagnosis not present

## 2020-01-02 DIAGNOSIS — R151 Fecal smearing: Secondary | ICD-10-CM | POA: Diagnosis not present

## 2020-01-02 DIAGNOSIS — Z20822 Contact with and (suspected) exposure to covid-19: Secondary | ICD-10-CM | POA: Diagnosis not present

## 2020-01-02 DIAGNOSIS — Z86010 Personal history of colonic polyps: Secondary | ICD-10-CM | POA: Diagnosis not present

## 2020-01-02 DIAGNOSIS — R110 Nausea: Secondary | ICD-10-CM | POA: Diagnosis not present

## 2020-01-02 DIAGNOSIS — R12 Heartburn: Secondary | ICD-10-CM | POA: Diagnosis not present

## 2020-01-02 DIAGNOSIS — R1314 Dysphagia, pharyngoesophageal phase: Secondary | ICD-10-CM | POA: Diagnosis not present

## 2020-01-02 DIAGNOSIS — R159 Full incontinence of feces: Secondary | ICD-10-CM | POA: Diagnosis not present

## 2020-01-09 DIAGNOSIS — G8929 Other chronic pain: Secondary | ICD-10-CM | POA: Diagnosis not present

## 2020-01-09 DIAGNOSIS — M5442 Lumbago with sciatica, left side: Secondary | ICD-10-CM | POA: Diagnosis not present

## 2020-01-09 DIAGNOSIS — M5441 Lumbago with sciatica, right side: Secondary | ICD-10-CM | POA: Diagnosis not present

## 2020-01-09 DIAGNOSIS — M545 Low back pain: Secondary | ICD-10-CM | POA: Diagnosis not present

## 2020-01-09 DIAGNOSIS — M5136 Other intervertebral disc degeneration, lumbar region: Secondary | ICD-10-CM | POA: Diagnosis not present

## 2020-01-09 DIAGNOSIS — M47816 Spondylosis without myelopathy or radiculopathy, lumbar region: Secondary | ICD-10-CM | POA: Diagnosis not present

## 2020-01-15 DIAGNOSIS — M51369 Other intervertebral disc degeneration, lumbar region without mention of lumbar back pain or lower extremity pain: Secondary | ICD-10-CM | POA: Insufficient documentation

## 2020-01-15 DIAGNOSIS — Z9889 Other specified postprocedural states: Secondary | ICD-10-CM | POA: Diagnosis not present

## 2020-01-15 DIAGNOSIS — M47816 Spondylosis without myelopathy or radiculopathy, lumbar region: Secondary | ICD-10-CM | POA: Diagnosis not present

## 2020-01-15 DIAGNOSIS — M5136 Other intervertebral disc degeneration, lumbar region: Secondary | ICD-10-CM | POA: Diagnosis not present

## 2020-01-26 DIAGNOSIS — M545 Low back pain, unspecified: Secondary | ICD-10-CM | POA: Diagnosis not present

## 2020-01-26 DIAGNOSIS — M5416 Radiculopathy, lumbar region: Secondary | ICD-10-CM | POA: Diagnosis not present

## 2020-01-26 DIAGNOSIS — M542 Cervicalgia: Secondary | ICD-10-CM | POA: Diagnosis not present

## 2020-01-26 DIAGNOSIS — G8929 Other chronic pain: Secondary | ICD-10-CM | POA: Diagnosis not present

## 2020-01-26 DIAGNOSIS — Z79899 Other long term (current) drug therapy: Secondary | ICD-10-CM | POA: Diagnosis not present

## 2020-02-02 DIAGNOSIS — E7800 Pure hypercholesterolemia, unspecified: Secondary | ICD-10-CM | POA: Diagnosis not present

## 2020-02-02 DIAGNOSIS — Z789 Other specified health status: Secondary | ICD-10-CM | POA: Diagnosis not present

## 2020-02-02 DIAGNOSIS — I10 Essential (primary) hypertension: Secondary | ICD-10-CM | POA: Diagnosis not present

## 2020-03-17 DIAGNOSIS — I10 Essential (primary) hypertension: Secondary | ICD-10-CM | POA: Diagnosis not present

## 2020-03-17 DIAGNOSIS — E7800 Pure hypercholesterolemia, unspecified: Secondary | ICD-10-CM | POA: Diagnosis not present

## 2020-03-17 DIAGNOSIS — Z789 Other specified health status: Secondary | ICD-10-CM | POA: Diagnosis not present

## 2020-03-29 DIAGNOSIS — R5383 Other fatigue: Secondary | ICD-10-CM | POA: Diagnosis not present

## 2020-03-29 DIAGNOSIS — Z79899 Other long term (current) drug therapy: Secondary | ICD-10-CM | POA: Diagnosis not present

## 2020-03-29 DIAGNOSIS — Z1159 Encounter for screening for other viral diseases: Secondary | ICD-10-CM | POA: Diagnosis not present

## 2020-03-29 DIAGNOSIS — M5412 Radiculopathy, cervical region: Secondary | ICD-10-CM | POA: Diagnosis not present

## 2020-03-29 DIAGNOSIS — G8929 Other chronic pain: Secondary | ICD-10-CM | POA: Diagnosis not present

## 2020-03-29 DIAGNOSIS — M5416 Radiculopathy, lumbar region: Secondary | ICD-10-CM | POA: Diagnosis not present

## 2020-03-29 DIAGNOSIS — M542 Cervicalgia: Secondary | ICD-10-CM | POA: Diagnosis not present

## 2020-03-29 DIAGNOSIS — E538 Deficiency of other specified B group vitamins: Secondary | ICD-10-CM | POA: Diagnosis not present

## 2020-03-29 DIAGNOSIS — E7800 Pure hypercholesterolemia, unspecified: Secondary | ICD-10-CM | POA: Diagnosis not present

## 2020-04-15 DIAGNOSIS — I10 Essential (primary) hypertension: Secondary | ICD-10-CM | POA: Diagnosis not present

## 2020-04-15 DIAGNOSIS — Z789 Other specified health status: Secondary | ICD-10-CM | POA: Diagnosis not present

## 2020-04-15 DIAGNOSIS — E7800 Pure hypercholesterolemia, unspecified: Secondary | ICD-10-CM | POA: Diagnosis not present

## 2021-01-11 ENCOUNTER — Emergency Department (HOSPITAL_BASED_OUTPATIENT_CLINIC_OR_DEPARTMENT_OTHER): Payer: Medicare Other

## 2021-01-11 ENCOUNTER — Encounter (HOSPITAL_BASED_OUTPATIENT_CLINIC_OR_DEPARTMENT_OTHER): Payer: Self-pay | Admitting: Emergency Medicine

## 2021-01-11 ENCOUNTER — Other Ambulatory Visit: Payer: Self-pay

## 2021-01-11 ENCOUNTER — Emergency Department (HOSPITAL_BASED_OUTPATIENT_CLINIC_OR_DEPARTMENT_OTHER)
Admission: EM | Admit: 2021-01-11 | Discharge: 2021-01-11 | Disposition: A | Discharge status: Home / Self Care | Payer: Medicare Other | Attending: Emergency Medicine | Admitting: Emergency Medicine

## 2021-01-11 DIAGNOSIS — M79604 Pain in right leg: Secondary | ICD-10-CM | POA: Diagnosis not present

## 2021-01-11 DIAGNOSIS — Z8616 Personal history of COVID-19: Secondary | ICD-10-CM | POA: Insufficient documentation

## 2021-01-11 DIAGNOSIS — E860 Dehydration: Secondary | ICD-10-CM | POA: Diagnosis not present

## 2021-01-11 DIAGNOSIS — E875 Hyperkalemia: Secondary | ICD-10-CM | POA: Diagnosis not present

## 2021-01-11 DIAGNOSIS — R5383 Other fatigue: Secondary | ICD-10-CM | POA: Insufficient documentation

## 2021-01-11 DIAGNOSIS — R0602 Shortness of breath: Secondary | ICD-10-CM | POA: Diagnosis not present

## 2021-01-11 DIAGNOSIS — N289 Disorder of kidney and ureter, unspecified: Secondary | ICD-10-CM

## 2021-01-11 DIAGNOSIS — I10 Essential (primary) hypertension: Secondary | ICD-10-CM | POA: Diagnosis not present

## 2021-01-11 DIAGNOSIS — R799 Abnormal finding of blood chemistry, unspecified: Secondary | ICD-10-CM | POA: Insufficient documentation

## 2021-01-11 DIAGNOSIS — R911 Solitary pulmonary nodule: Secondary | ICD-10-CM

## 2021-01-11 HISTORY — DX: Unspecified osteoarthritis, unspecified site: M19.90

## 2021-01-11 HISTORY — DX: Essential (primary) hypertension: I10

## 2021-01-11 LAB — URINALYSIS, MICROSCOPIC (REFLEX)

## 2021-01-11 LAB — URINALYSIS, ROUTINE W REFLEX MICROSCOPIC
Bilirubin Urine: NEGATIVE
Glucose, UA: NEGATIVE mg/dL
Ketones, ur: NEGATIVE mg/dL
Leukocytes,Ua: NEGATIVE
Nitrite: NEGATIVE
Protein, ur: NEGATIVE mg/dL
Specific Gravity, Urine: 1.005 (ref 1.005–1.030)
pH: 6.5 (ref 5.0–8.0)

## 2021-01-11 LAB — CBC WITH DIFFERENTIAL/PLATELET
Abs Immature Granulocytes: 0.05 10*3/uL (ref 0.00–0.07)
Basophils Absolute: 0.1 10*3/uL (ref 0.0–0.1)
Basophils Relative: 1 %
Eosinophils Absolute: 0.2 10*3/uL (ref 0.0–0.5)
Eosinophils Relative: 1 %
HCT: 37.1 % (ref 36.0–46.0)
Hemoglobin: 11.8 g/dL — ABNORMAL LOW (ref 12.0–15.0)
Immature Granulocytes: 0 %
Lymphocytes Relative: 13 %
Lymphs Abs: 1.7 10*3/uL (ref 0.7–4.0)
MCH: 26.2 pg (ref 26.0–34.0)
MCHC: 31.8 g/dL (ref 30.0–36.0)
MCV: 82.4 fL (ref 80.0–100.0)
Monocytes Absolute: 0.7 10*3/uL (ref 0.1–1.0)
Monocytes Relative: 5 %
Neutro Abs: 10.4 10*3/uL — ABNORMAL HIGH (ref 1.7–7.7)
Neutrophils Relative %: 80 %
Platelets: 375 10*3/uL (ref 150–400)
RBC: 4.5 MIL/uL (ref 3.87–5.11)
RDW: 15.8 % — ABNORMAL HIGH (ref 11.5–15.5)
WBC: 13 10*3/uL — ABNORMAL HIGH (ref 4.0–10.5)
nRBC: 0 % (ref 0.0–0.2)

## 2021-01-11 LAB — COMPREHENSIVE METABOLIC PANEL
ALT: 11 U/L (ref 0–44)
AST: 15 U/L (ref 15–41)
Albumin: 3.8 g/dL (ref 3.5–5.0)
Alkaline Phosphatase: 61 U/L (ref 38–126)
Anion gap: 8 (ref 5–15)
BUN: 47 mg/dL — ABNORMAL HIGH (ref 8–23)
CO2: 26 mmol/L (ref 22–32)
Calcium: 9.1 mg/dL (ref 8.9–10.3)
Chloride: 101 mmol/L (ref 98–111)
Creatinine, Ser: 1.27 mg/dL — ABNORMAL HIGH (ref 0.44–1.00)
GFR, Estimated: 42 mL/min — ABNORMAL LOW (ref >60)
Glucose, Bld: 98 mg/dL (ref 70–99)
Potassium: 5.3 mmol/L — ABNORMAL HIGH (ref 3.5–5.1)
Sodium: 135 mmol/L (ref 135–145)
Total Bilirubin: 0.3 mg/dL (ref 0.3–1.2)
Total Protein: 7.6 g/dL (ref 6.5–8.1)

## 2021-01-11 MED ORDER — SODIUM CHLORIDE 0.9 % IV BOLUS
1000.0000 mL | Freq: Once | INTRAVENOUS | Status: AC
  Administered 2021-01-11: 1000 mL via INTRAVENOUS

## 2021-01-11 MED ORDER — IOHEXOL 350 MG/ML SOLN
100.0000 mL | Freq: Once | INTRAVENOUS | Status: AC | PRN
  Administered 2021-01-11: 60 mL via INTRAVENOUS

## 2021-01-11 MED ORDER — LORAZEPAM 1 MG PO TABS
0.5000 mg | ORAL_TABLET | Freq: Once | ORAL | Status: AC
  Administered 2021-01-11: 0.5 mg via ORAL
  Filled 2021-01-11: qty 1

## 2021-01-11 NOTE — ED Provider Notes (Signed)
[facility] EMERGENCY DEPARTMENT Provider Note   CSN: [id number] Arrival date & time: 01/11/21  1023     History Chief Complaint  Patient presents with   Abnormal Lab    Jaime Mullins is a 86 y.o. female.  HPI 86 year old female presents after feeling poorly for about a month.  She had congestion/cold that went into her lungs and was treated for pneumonia, finished antibiotics a week ago.  She is still not feeling great including having lost weight since getting COVID back in February and feeling generally fatigued.  She does not eat and drink much.  Went back to her PCP who noted she was hyperkalemic.  Stopped spironolactone and gave her Lasix and on recheck today she is still hyperkalemic with a potassium of 5.9.  The chest x-ray looks improved according to outside records from the pneumonia.  The patient denies chest pain or shortness of breath.  She denies any type of cough.  She has chronic urinary issues but no dysuria or UTI symptoms today.  She has been noting that her right leg has been bothering her with some cramping and calf pain for about 1 or 2 weeks.  She tells me that the only meds she is on right now are gabapentin, pantoprazole, and she is no longer taking spironolactone. BP was low at the office and so she was sent here for fluids.   Past Medical History:  Diagnosis Date   Arthritis    Hypertension     There are no problems to display for this patient.   Past Surgical History:  Procedure Laterality Date   ABDOMINAL HYSTERECTOMY     BACK SURGERY     CHOLECYSTECTOMY     NECK SURGERY       OB History   No obstetric history on file.     Family History  Problem Relation Age of Onset   Breast cancer Mother    Breast cancer Sister    Breast cancer Cousin     Social History   Tobacco Use   Smoking status: Never   Smokeless tobacco: Never  Vaping Use   Vaping Use: Never used  Substance Use Topics   Alcohol use: Never   Drug use: Never     Home Medications Prior to Admission medications   Not on File    Allergies    Penicillins and Nsaids  Review of Systems   Review of Systems  Constitutional:  Positive for fatigue. Negative for fever.  HENT:  Positive for congestion.   Respiratory:  Negative for cough and shortness of breath.   Cardiovascular:  Negative for chest pain.  Gastrointestinal:  Negative for abdominal pain, diarrhea and vomiting.  Genitourinary:  Negative for dysuria.  Musculoskeletal:  Positive for myalgias.  Neurological:  Positive for weakness.  All other systems reviewed and are negative.  Physical Exam Updated Vital Signs BP (!) 147/67 (BP Location: Right Arm)   Pulse 73   Temp 97.7 F (36.5 C) (Oral)   Resp (!) 23   Ht 5' 4.5" (1.638 m)   Wt 77.1 kg   SpO2 97%   BMI 28.73 kg/m   Physical Exam Vitals and nursing note reviewed.  Constitutional:      General: She is not in acute distress.    Appearance: She is well-developed. She is not ill-appearing or diaphoretic.  HENT:     Head: Normocephalic and atraumatic.     Right Ear: External ear normal.     Left Ear:  External ear normal.     Nose: Nose normal.     Mouth/Throat:     Mouth: Mucous membranes are dry.  Eyes:     General:        Right eye: No discharge.        Left eye: No discharge.     Extraocular Movements: Extraocular movements intact.     Pupils: Pupils are equal, round, and reactive to light.  Cardiovascular:     Rate and Rhythm: Normal rate and regular rhythm.     Heart sounds: Normal heart sounds.  Pulmonary:     Effort: Pulmonary effort is normal.     Breath sounds: Normal breath sounds. No wheezing or rales.  Abdominal:     General: There is no distension.     Palpations: Abdomen is soft.     Tenderness: There is no abdominal tenderness.  Musculoskeletal:     Comments: Some mild tenderness without swelling in the right lower leg  Skin:    General: Skin is warm and dry.  Neurological:     Mental  Status: She is alert.     Comments: CN 3-12 grossly intact. 5/5 strength in all 4 extremities. Normal finger to nose.   Psychiatric:        Mood and Affect: Mood is not anxious.    ED Results / Procedures / Treatments   Labs (all labs ordered are listed, but only abnormal results are displayed) Labs Reviewed  COMPREHENSIVE METABOLIC PANEL - Abnormal; Notable for the following components:      Result Value   Potassium 5.3 (*)    BUN 47 (*)    Creatinine, Ser 1.27 (*)    GFR, Estimated 42 (*)    All other components within normal limits  CBC WITH DIFFERENTIAL/PLATELET - Abnormal; Notable for the following components:   WBC 13.0 (*)    Hemoglobin 11.8 (*)    RDW 15.8 (*)    Neutro Abs 10.4 (*)    All other components within normal limits  URINALYSIS, ROUTINE W REFLEX MICROSCOPIC    EKG EKG Interpretation  Date/Time:  Tuesday January 11 2021 12:40:56 EDT Ventricular Rate:  66 PR Interval:  221 QRS Duration: 115 QT Interval:  441 QTC Calculation: 463 R Axis:   -28 Text Interpretation: Age not entered, assumed to be  86 years old for purpose of ECG interpretation Sinus rhythm Prolonged PR interval Nonspecific intraventricular conduction delay Anterior infarct, old No old tracing to compare Confirmed by Sherwood Gambler [phone]) on 01/11/2021 12:51:37 PM  Radiology DG Tibia/Fibula Right  Result Date: 01/11/2021 CLINICAL DATA:  Right leg pain. EXAM: RIGHT TIBIA AND FIBULA - 2 VIEW COMPARISON:  None. FINDINGS: There is no evidence of fracture or other focal bone lesions. Soft tissues are unremarkable. IMPRESSION: Negative. Electronically Signed   By: Marijo Conception M.D.   On: 01/11/2021 13:46   US Venous Img Lower Right (DVT Study)  Result Date: 01/11/2021 CLINICAL DATA:  Right calf pain for the past 1-2 weeks. Evaluate for DVT. EXAM: RIGHT LOWER EXTREMITY VENOUS DOPPLER ULTRASOUND TECHNIQUE: Gray-scale sonography with graded compression, as well as color Doppler and duplex  ultrasound were performed to evaluate the lower extremity deep venous systems from the level of the common femoral vein and including the common femoral, femoral, profunda femoral, popliteal and calf veins including the posterior tibial, peroneal and gastrocnemius veins when visible. The superficial great saphenous vein was also interrogated. Spectral Doppler was utilized to evaluate flow at rest  and with distal augmentation maneuvers in the common femoral, femoral and popliteal veins. COMPARISON:  None. FINDINGS: Contralateral Common Femoral Vein: Respiratory phasicity is normal and symmetric with the symptomatic side. No evidence of thrombus. Normal compressibility. Common Femoral Vein: No evidence of thrombus. Normal compressibility, respiratory phasicity and response to augmentation. Saphenofemoral Junction: No evidence of thrombus. Normal compressibility and flow on color Doppler imaging. Profunda Femoral Vein: No evidence of thrombus. Normal compressibility and flow on color Doppler imaging. Femoral Vein: No evidence of thrombus. Normal compressibility, respiratory phasicity and response to augmentation. Popliteal Vein: No evidence of thrombus. Normal compressibility, respiratory phasicity and response to augmentation. Calf Veins: No evidence of thrombus. Normal compressibility and flow on color Doppler imaging. Superficial Great Saphenous Vein: No evidence of thrombus. Normal compressibility. Venous Reflux:  None. Other Findings:  None. IMPRESSION: No evidence of DVT within the right lower extremity. Electronically Signed   By: Sandi Mariscal M.D.   On: 01/11/2021 13:51    Procedures Procedures   Medications Ordered in ED Medications  sodium chloride 0.9 % bolus 1,000 mL (1,000 mLs Intravenous New Bag/Given 01/11/21 1411)  iohexol (OMNIPAQUE) 350 MG/ML injection 100 mL (60 mLs Intravenous Contrast Given 01/11/21 1458)  LORazepam (ATIVAN) tablet 0.5 mg (0.5 mg Oral Given 01/11/21 1407)    ED Course  I  have reviewed the triage vital signs and the nursing notes.  Pertinent labs & imaging results that were available during my care of the patient were reviewed by me and considered in my medical decision making (see chart for details).    MDM Rules/Calculators/A&P                           Given her right leg symptoms and fatigue, will get CTA to rule out PE and/or mass given pneumonia with minimal symptoms of fatigue.  We will also check urinalysis given age and chronic incontinence.  She does appear dehydrated based on her elevated BUN.  She is mildly hyperkalemic but her potassium was 5.9 earlier today and given improvement I think is reasonable progress of fluid she can follow-up with PCP.  There are no obvious EKG changes. Care to Dr. Darl Householder. Final Clinical Impression(s) / ED Diagnoses Final diagnoses:  None    Rx / DC Orders ED Discharge Orders     None        Sherwood Gambler, MD 01/11/21 1523

## 2021-01-11 NOTE — ED Notes (Signed)
Pt went to xray , and [place] and states she cannot do the CT without meds , dr made aware

## 2021-01-11 NOTE — ED Triage Notes (Signed)
Pt has been  having URI for 5 weeks.  Pt states she was diagnosed 2 weeks ago with viral pneumonia.  Pt states she was told by pcp that her labs were K+ was high.  Pt states she isnt feeling any better.

## 2021-01-11 NOTE — Discharge Instructions (Addendum)
Your kidney function is slightly elevated.  You are mildly dehydrated and your potassium is slightly high.  Please stay hydrated and avoid any foods high in potassium.  You will need to recheck your kidney function with your doctor in a week  You have lung nodules that needs to be followed up with your doctor.  See your doctor next week and also recheck your blood pressure with your doctor  Return to ER if you have worse weakness, shortness of breath, chest pain, fever

## 2021-01-11 NOTE — ED Provider Notes (Signed)
  Physical Exam  BP (!) 191/89   Pulse (!) 54   Temp 97.7 F (36.5 C) (Oral)   Resp 13   Ht 5' 4.5" (1.638 m)   Wt 77.1 kg   SpO2 95%   BMI 28.73 kg/m   Physical Exam  ED Course/Procedures     Procedures  MDM  Care assumed at 3 pm.  Patient has recently finished antibiotics for pneumonia.  Patient is also feeling weak.  Labs showed mild AKI and potassium of 5.3.  Patient's potassium was 5.9 earlier and spironolactone was discontinued.  Signout pending CTA chest to rule out PE or mass and urinalysis.  5:00 PM CT did not show any PE.  Patient has hiatal hernia and fibrotic changes. Patient has no obvious pneumonia. Patient does have a lung nodule that needs follow-up. Urinalysis is unremarkable. Stable for discharge. Recommend hydration and repeat BMP in a week       Drenda Freeze, MD 01/11/21 1701

## 2021-01-17 DIAGNOSIS — I10 Essential (primary) hypertension: Secondary | ICD-10-CM | POA: Insufficient documentation

## 2021-01-17 DIAGNOSIS — I739 Peripheral vascular disease, unspecified: Secondary | ICD-10-CM | POA: Insufficient documentation

## 2021-01-17 DIAGNOSIS — M1990 Unspecified osteoarthritis, unspecified site: Secondary | ICD-10-CM | POA: Insufficient documentation

## 2021-01-17 DIAGNOSIS — I700 Atherosclerosis of aorta: Secondary | ICD-10-CM | POA: Insufficient documentation

## 2021-01-17 DIAGNOSIS — K5792 Diverticulitis of intestine, part unspecified, without perforation or abscess without bleeding: Secondary | ICD-10-CM | POA: Insufficient documentation

## 2021-02-21 DIAGNOSIS — G4733 Obstructive sleep apnea (adult) (pediatric): Secondary | ICD-10-CM | POA: Insufficient documentation

## 2023-04-12 IMAGING — CR DG TIBIA/FIBULA 2V*R*
4 series · 4 of 4 positions shown · non-contrast
Comparison: None.

CLINICAL DATA: Right leg pain.

EXAM:
RIGHT TIBIA AND FIBULA - 2 VIEW

[t tib/fib ap right (1 of 2)]
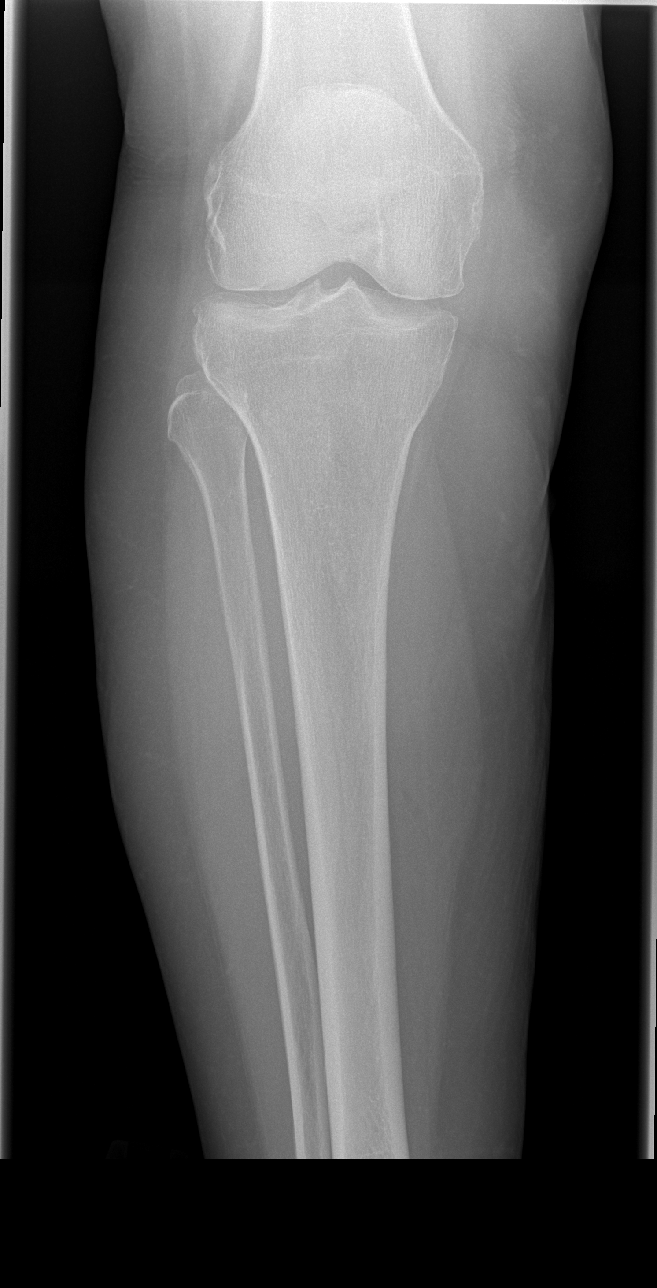

[t tib/fib ap right (2 of 2)]
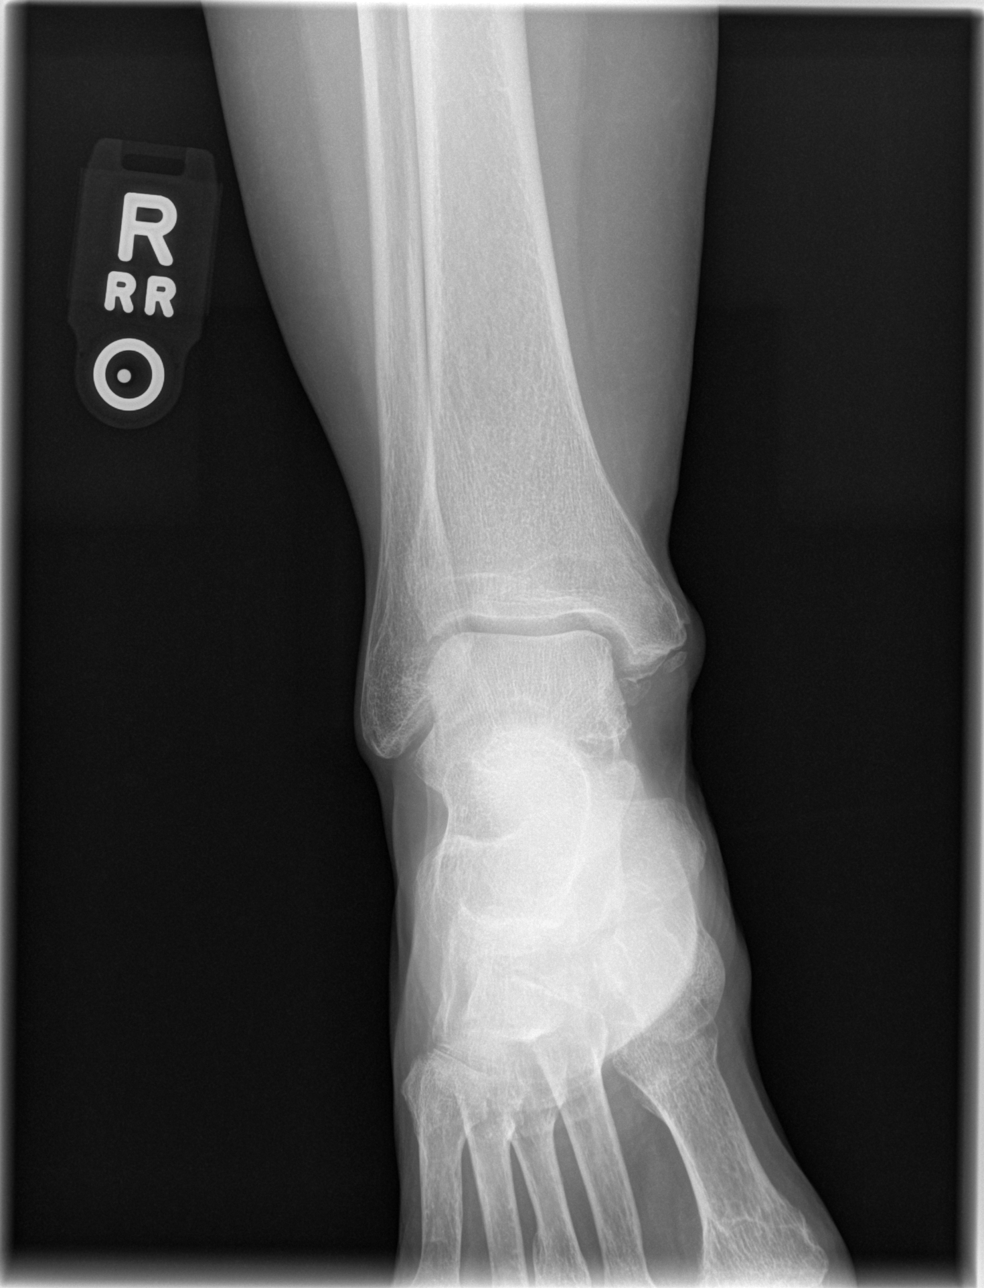

[t tib/fib lat right (1 of 2)]
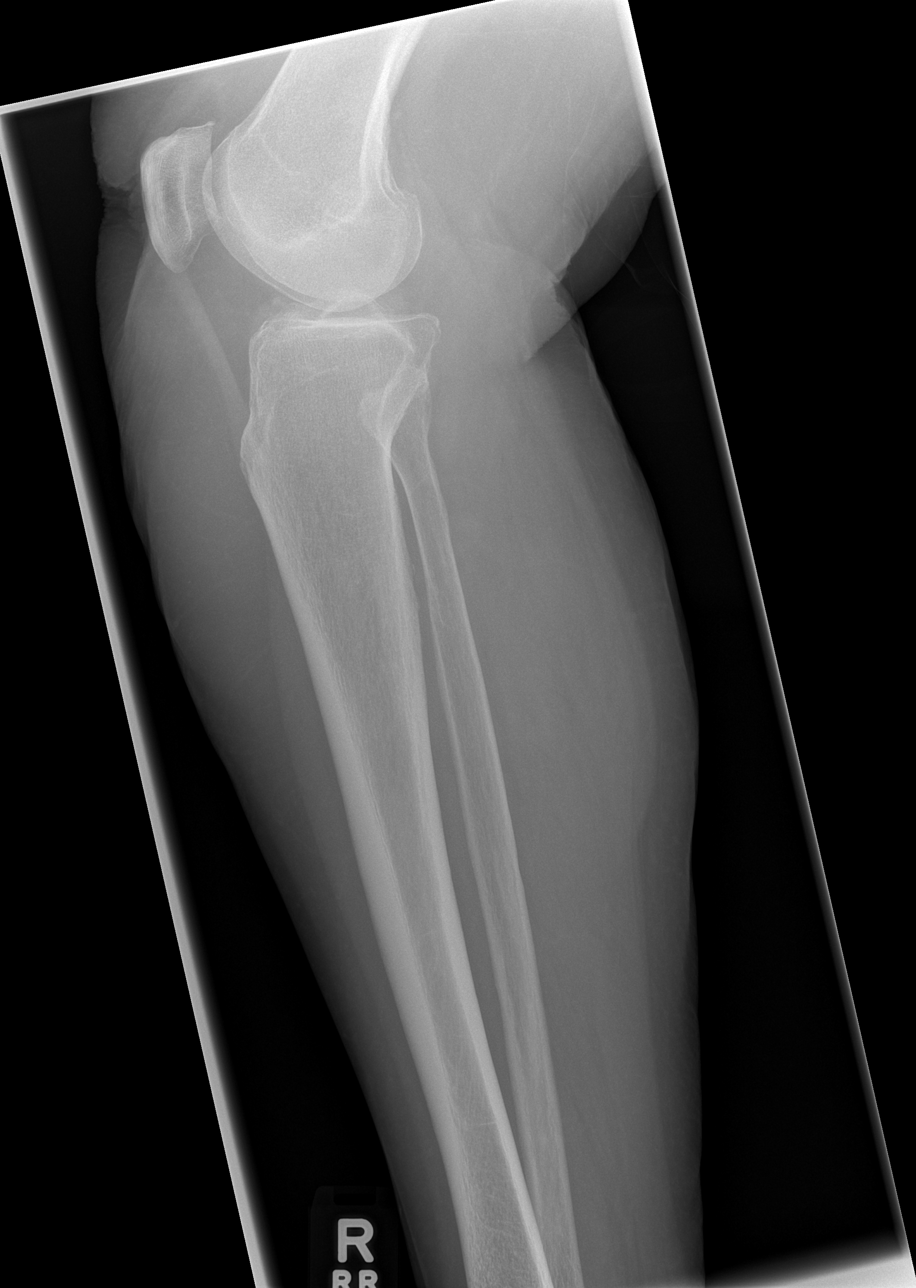

[t tib/fib lat right (2 of 2)]
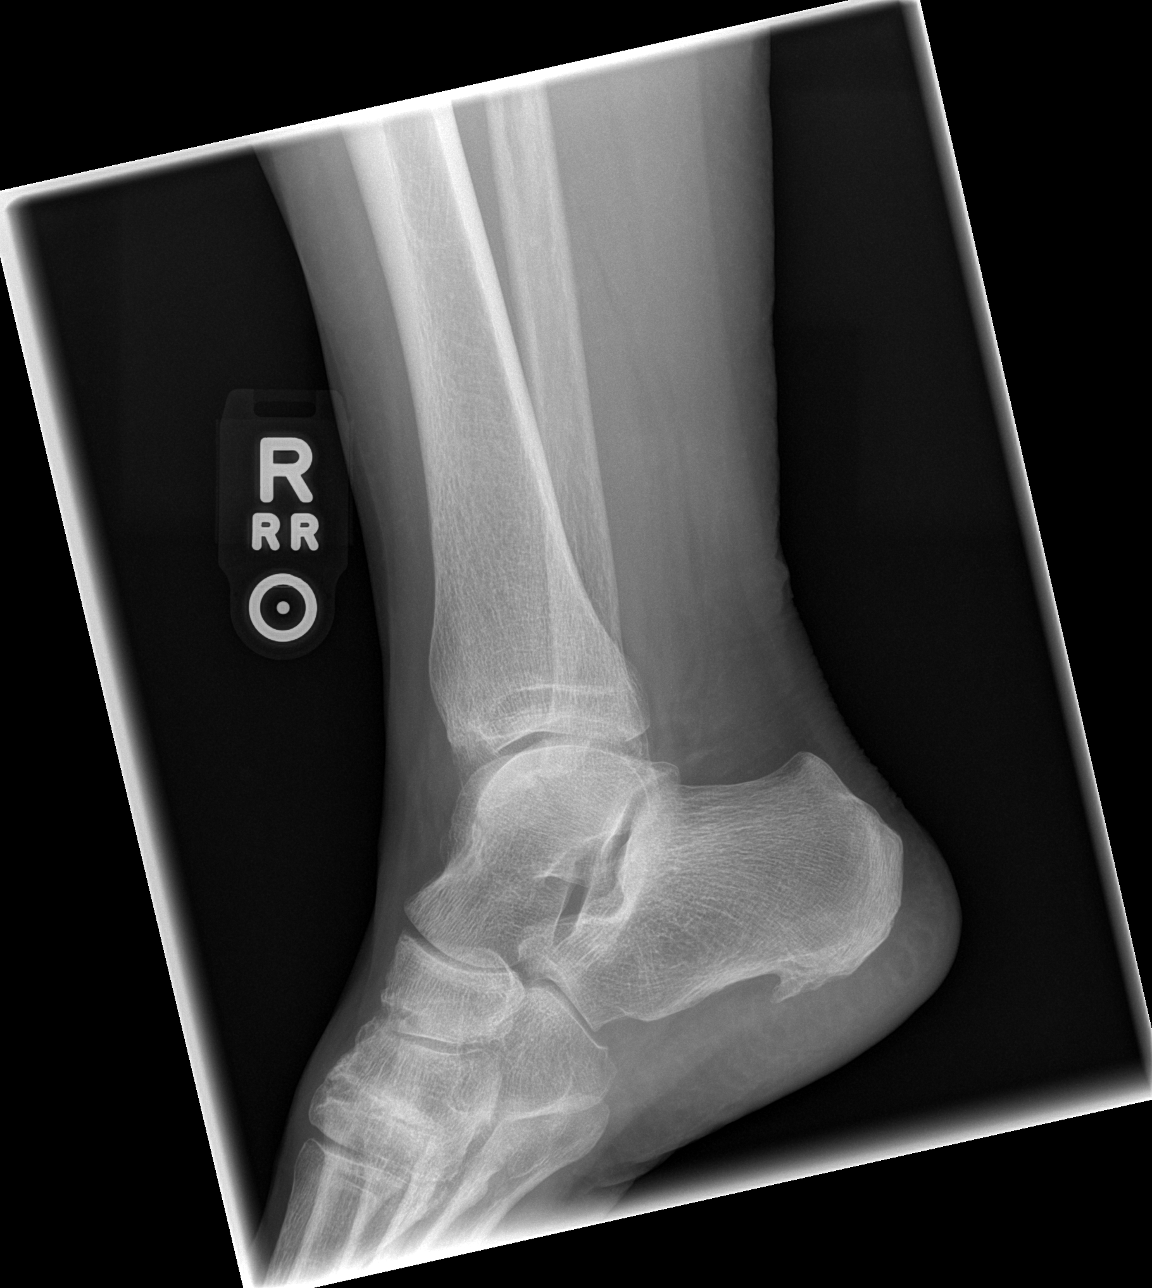

[4 of 4 positions shown; findings below may reference images not displayed]

FINDINGS: There is no evidence of fracture or other focal bone lesions. Soft
tissues are unremarkable.
IMPRESSION: Negative.

## 2023-06-12 ENCOUNTER — Ambulatory Visit (INDEPENDENT_AMBULATORY_CARE_PROVIDER_SITE_OTHER): Payer: Medicare Other | Admitting: Physician Assistant

## 2023-06-12 ENCOUNTER — Encounter: Payer: Self-pay | Admitting: Physician Assistant

## 2023-06-12 VITALS — BP 148/90 | HR 86 | Temp 97.7°F | Ht 64.0 in | Wt 190.2 lb

## 2023-06-12 DIAGNOSIS — B0233 Zoster keratitis: Secondary | ICD-10-CM | POA: Diagnosis not present

## 2023-06-12 DIAGNOSIS — I10 Essential (primary) hypertension: Secondary | ICD-10-CM

## 2023-06-12 DIAGNOSIS — R519 Headache, unspecified: Secondary | ICD-10-CM

## 2023-06-12 DIAGNOSIS — Z9049 Acquired absence of other specified parts of digestive tract: Secondary | ICD-10-CM

## 2023-06-12 DIAGNOSIS — R32 Unspecified urinary incontinence: Secondary | ICD-10-CM | POA: Diagnosis not present

## 2023-06-12 DIAGNOSIS — K219 Gastro-esophageal reflux disease without esophagitis: Secondary | ICD-10-CM | POA: Insufficient documentation

## 2023-06-12 DIAGNOSIS — G8929 Other chronic pain: Secondary | ICD-10-CM

## 2023-06-12 DIAGNOSIS — R159 Full incontinence of feces: Secondary | ICD-10-CM

## 2023-06-12 NOTE — Progress Notes (Unsigned)
New patient visit   Patient: Jaime Mullins   DOB: Jul 18, 1934   89 y.o. Female  MRN: [id number] Visit Date: 06/12/2023  Today's healthcare provider: Alfredia Ferguson, PA-C   No chief complaint on file.  Subjective    Jaime Mullins is a 89 y.o. female who presents today as a new patient to establish care.   Lasix?  Bill Salinas  S/p diagnostic laparoscopy with lysis of adhesions, hand-assist sigmoidectomy with primary stapled end-to-end anastomosis, flexible sigmoidoscopy on 03/29/22 with Dr. Almeta Monas divertic Past Medical History:  Diagnosis Date   Arthritis    Hypertension    Past Surgical History:  Procedure Laterality Date   ABDOMINAL HYSTERECTOMY     BACK SURGERY     CHOLECYSTECTOMY     NECK SURGERY     Family Status  Relation Name Status   Mother  (Not Specified)   Sister  (Not Specified)   Cousin  (Not Specified)  No partnership data on file   Family History  Problem Relation Age of Onset   Breast cancer Mother    Breast cancer Sister    Breast cancer Cousin    Social History   Socioeconomic History   Marital status: Unknown    Spouse name: Not on file   Number of children: Not on file   Years of education: Not on file   Highest education level: Not on file  Occupational History   Not on file  Tobacco Use   Smoking status: Never   Smokeless tobacco: Never  Vaping Use   Vaping status: Never Used  Substance and Sexual Activity   Alcohol use: Never   Drug use: Never   Sexual activity: Never  Other Topics Concern   Not on file  Social History Narrative   Not on file   Social Drivers of Health   Financial Resource Strain: Not on file  Food Insecurity: Low Risk  (03/29/2022)   Received from [facility]   Hunger Vital Sign    Worried About Running Out of Food in the Last Year: Never true    Within the past 12 months, the food you bought just didn't last and you didn't have money to get more: Not on file  Transportation Needs:  Not on file (03/29/2022)  Physical Activity: Not on file  Stress: Not on file  Social Connections: Not on file   No outpatient medications prior to visit.   No facility-administered medications prior to visit.   Allergies  Allergen Reactions   Penicillins Rash   Nsaids Other (See Comments)    Immunization History  Administered Date(s) Administered   Fluad Quad(high Dose 65+) 04/07/2022   Hep B, Unspecified 11/22/1998, 10/28/1999   Pneumococcal Conjugate-13 12/30/2014   Tdap 07/23/2012    Health Maintenance  Topic Date Due   Medicare Annual Wellness (AWV)  Never done   Zoster Vaccines- Shingrix (1 of 2) Never done   DEXA SCAN  Never done   Pneumonia Vaccine 79+ Years old (2 of 2 - PPSV23 or PCV20) 12/30/2015   DTaP/Tdap/Td (2 - Td or Tdap) 07/24/2022   INFLUENZA VACCINE  11/23/2022   COVID-19 Vaccine (1 - 2024-25 season) Never done   HPV VACCINES  Aged Out    Patient Care Team: Bill Salinas I, NP as PCP - General (Nurse Practitioner)  Review of Systems  {Insert previous labs (optional):23779} {See past labs  Heme  Chem  Endocrine  Serology  Results Review (optional):1}   Objective  There were no vitals taken for this visit. {Insert last BP/Wt (optional):23777}{See vitals history (optional):1}   Physical Exam Constitutional:      General: She is awake.     Appearance: She is well-developed.  HENT:     Head: Normocephalic.  Eyes:     Extraocular Movements: Extraocular movements intact.     Conjunctiva/sclera: Conjunctivae normal.     Pupils: Pupils are equal, round, and reactive to light.  Cardiovascular:     Rate and Rhythm: Normal rate and regular rhythm.     Heart sounds: Normal heart sounds.  Pulmonary:     Effort: Pulmonary effort is normal.     Breath sounds: Normal breath sounds.  Skin:    General: Skin is warm.  Neurological:     General: No focal deficit present.     Mental Status: She is alert and oriented to person, place, and  time.  Psychiatric:        Attention and Perception: Attention normal.        Mood and Affect: Mood normal.        Speech: Speech normal.        Behavior: Behavior is cooperative.    ***  Depression Screen     No data to display         No results found for any visits on 06/12/23.  Assessment & Plan     There are no diagnoses linked to this encounter.   No follow-ups on file.      Alfredia Ferguson, PA-C  [facility] at [facility] [phone] (phone) [phone] (fax)  [facility]

## 2023-06-13 ENCOUNTER — Encounter: Payer: Self-pay | Admitting: Physician Assistant

## 2023-06-13 ENCOUNTER — Other Ambulatory Visit: Payer: Self-pay | Admitting: Physician Assistant

## 2023-06-13 DIAGNOSIS — R519 Headache, unspecified: Secondary | ICD-10-CM | POA: Insufficient documentation

## 2023-06-13 DIAGNOSIS — R739 Hyperglycemia, unspecified: Secondary | ICD-10-CM

## 2023-06-13 DIAGNOSIS — B0233 Zoster keratitis: Secondary | ICD-10-CM | POA: Insufficient documentation

## 2023-06-13 DIAGNOSIS — R159 Full incontinence of feces: Secondary | ICD-10-CM | POA: Insufficient documentation

## 2023-06-13 LAB — CBC WITH DIFFERENTIAL/PLATELET
Basophils Absolute: 0.1 10*3/uL (ref 0.0–0.2)
Basos: 1 %
EOS (ABSOLUTE): 0.1 10*3/uL (ref 0.0–0.4)
Eos: 2 %
Hematocrit: 42.5 % (ref 34.0–46.6)
Hemoglobin: 13.1 g/dL (ref 11.1–15.9)
Immature Grans (Abs): 0 10*3/uL (ref 0.0–0.1)
Immature Granulocytes: 0 %
Lymphocytes Absolute: 1.9 10*3/uL (ref 0.7–3.1)
Lymphs: 22 %
MCH: 26 pg — ABNORMAL LOW (ref 26.6–33.0)
MCHC: 30.8 g/dL — ABNORMAL LOW (ref 31.5–35.7)
MCV: 85 fL (ref 79–97)
Monocytes Absolute: 0.6 10*3/uL (ref 0.1–0.9)
Monocytes: 7 %
Neutrophils Absolute: 5.8 10*3/uL (ref 1.4–7.0)
Neutrophils: 68 %
Platelets: 348 10*3/uL (ref 150–450)
RBC: 5.03 x10E6/uL (ref 3.77–5.28)
RDW: 15.5 % — ABNORMAL HIGH (ref 11.7–15.4)
WBC: 8.5 10*3/uL (ref 3.4–10.8)

## 2023-06-13 LAB — COMPREHENSIVE METABOLIC PANEL
ALT: 10 [IU]/L (ref 0–32)
AST: 17 [IU]/L (ref 0–40)
Albumin: 4.1 g/dL (ref 3.7–4.7)
Alkaline Phosphatase: 79 [IU]/L (ref 44–121)
BUN/Creatinine Ratio: 26 (ref 12–28)
BUN: 27 mg/dL (ref 8–27)
Bilirubin Total: <0.2 mg/dL (ref 0.0–1.2)
CO2: 24 mmol/L (ref 20–29)
Calcium: 9.6 mg/dL (ref 8.7–10.3)
Chloride: 102 mmol/L (ref 96–106)
Creatinine, Ser: 1.03 mg/dL — ABNORMAL HIGH (ref 0.57–1.00)
Globulin, Total: 3.2 g/dL (ref 1.5–4.5)
Glucose: 118 mg/dL — ABNORMAL HIGH (ref 70–99)
Potassium: 4.9 mmol/L (ref 3.5–5.2)
Sodium: 140 mmol/L (ref 134–144)
Total Protein: 7.3 g/dL (ref 6.0–8.5)
eGFR: 53 mL/min/{1.73_m2} — ABNORMAL LOW (ref >59)

## 2023-06-13 NOTE — Assessment & Plan Note (Signed)
Chronic and unchanged. Pt reports she has been to pelvic floor therapy, and has an unspecified bladder lift? Hernia repair? Surgery. Will table for further discussion, could trial oxybutynin or re-refer to pelvic floor therapy.

## 2023-06-13 NOTE — Assessment & Plan Note (Signed)
Ddx includes post herpetic neuralgia, new onset migraine.  Given new onset of severe headaches in an 89 y/o referring to neurology.  No specific TIA symptoms Discussed trial of gabapentin to tx potential post herpetic neuralgia-- pt is very opposed to this idea, h/o of poor tolerance to gabapentin. Pt reports she has not tried lyrica. Will obtain cmp and consider.

## 2023-06-13 NOTE — Assessment & Plan Note (Signed)
Given pt is not confident with diagnosis and treatment plan, and this is beyond my scope, referring to optho for a second opinion

## 2023-06-13 NOTE — Assessment & Plan Note (Signed)
Moderate control Manages with amlodipine 2.5 mg  Will monitor

## 2023-06-16 LAB — HGB A1C W/O EAG: Hgb A1c MFr Bld: 6.1 % — ABNORMAL HIGH (ref 4.8–5.6)

## 2023-06-16 LAB — SPECIMEN STATUS REPORT

## 2023-07-12 ENCOUNTER — Other Ambulatory Visit: Payer: Self-pay | Admitting: Physician Assistant

## 2023-07-12 NOTE — Telephone Encounter (Signed)
 Copied from CRM [id number]. Topic: Clinical - Medication Refill >> Jul 12, 2023  9:22 AM Adele Barthel wrote: Most Recent Primary Care Visit:  Provider: Alfredia Ferguson  Department: [facility]  Visit Type: NEW PT - OFFICE VISIT  Date: 06/12/2023  Medication: amLODipine (NORVASC) 2.5 MG tablet  Has the patient contacted their pharmacy? No (Agent: If no, request that the patient contact the pharmacy for the refill. If patient does not wish to contact the pharmacy document the reason why and proceed with request.) (Agent: If yes, when and what did the pharmacy advise?)  Is this the correct pharmacy for this prescription? Yes If no, delete pharmacy and type the correct one.  This is the patient's preferred pharmacy:  [facility] [place], [place] - [address] [place] [address] Phone: [phone] Fax: [phone]   Has the prescription been filled recently? Yes  Is the patient out of the medication? No  Has the patient been seen for an appointment in the last year OR does the patient have an upcoming appointment? Yes  Can we respond through MyChart? Yes  Agent: Please be advised that Rx refills may take up to 3 business days. We ask that you follow-up with your pharmacy.

## 2023-07-13 MED ORDER — AMLODIPINE BESYLATE 2.5 MG PO TABS: 2.5000 mg | ORAL_TABLET | Freq: Every day | ORAL | 2 refills | Status: DC

## 2023-09-18 ENCOUNTER — Ambulatory Visit (INDEPENDENT_AMBULATORY_CARE_PROVIDER_SITE_OTHER): Admitting: Physician Assistant

## 2023-09-18 ENCOUNTER — Encounter: Payer: Self-pay | Admitting: Physician Assistant

## 2023-09-18 VITALS — BP 140/79 | HR 82 | Ht 64.0 in | Wt 189.6 lb

## 2023-09-18 DIAGNOSIS — L304 Erythema intertrigo: Secondary | ICD-10-CM

## 2023-09-18 DIAGNOSIS — N9089 Other specified noninflammatory disorders of vulva and perineum: Secondary | ICD-10-CM | POA: Diagnosis not present

## 2023-09-18 DIAGNOSIS — R079 Chest pain, unspecified: Secondary | ICD-10-CM | POA: Diagnosis not present

## 2023-09-18 MED ORDER — CLOTRIMAZOLE 1 % EX CREA: 1.0000 | TOPICAL_CREAM | Freq: Two times a day (BID) | CUTANEOUS | 0 refills | Status: DC

## 2023-09-18 MED ORDER — DOXYCYCLINE HYCLATE 100 MG PO TABS: 100.0000 mg | ORAL_TABLET | Freq: Two times a day (BID) | ORAL | 0 refills | Status: AC

## 2023-09-18 NOTE — Progress Notes (Signed)
 Established patient visit   Patient: Jaime Mullins    DOB: 12-23-1934   89 y.o. Female  MRN: [id number] Visit Date: 09/18/2023  Today's healthcare provider: Trenton Frock, PA-C   Cc. Rash   Subjective     Discussed the use of AI scribe software for clinical note transcription with the patient, who gave verbal consent to proceed.  History of Present Illness   Jaime Mullins  is an 89 year old female who presents with a persistent rash in the genital area.  The rash began four weeks ago as a bump and has become increasingly red, spreading in the genital area. It is painful but not itchy, with no discharge. She maintains cleanliness using a squirt bottle, but there is no improvement.   No problems with urination or bowel movements.       Pt was seen in the ED 5/18 for chest pain, after work up, mild reversible ischemia was identified, thought to be artifact given normal troponin. Pt is requesting referral to cardiology.  Medications: Outpatient Medications Prior to Visit  Medication Sig   amLODipine  (NORVASC ) 2.5 MG tablet Take 1 tablet (2.5 mg total) by mouth daily.   famotidine (PEPCID) 40 MG tablet Take 40 mg by mouth daily.   furosemide (LASIX) 20 MG tablet Take 20 mg by mouth daily.   pantoprazole (PROTONIX) 40 MG tablet Take 40 mg by mouth daily.   No facility-administered medications prior to visit.    Review of Systems  Constitutional:  Negative for fatigue and fever.  Respiratory:  Negative for cough and shortness of breath.   Cardiovascular:  Negative for chest pain and leg swelling.  Gastrointestinal:  Negative for abdominal pain.  Skin:  Positive for rash.  Neurological:  Negative for dizziness and headaches.       Objective    BP (!) 140/79   Pulse 82   Ht 5\' 4"  (1.626 m)   Wt 189 lb 9.6 oz (86 kg)   BMI 32.54 kg/m    Physical Exam Vitals reviewed.  Constitutional:      Appearance: She is not ill-appearing.  HENT:     Head:  Normocephalic.  Eyes:     Conjunctiva/sclera: Conjunctivae normal.  Cardiovascular:     Rate and Rhythm: Normal rate.  Pulmonary:     Effort: Pulmonary effort is normal. No respiratory distress.  Genitourinary:    Comments: Raw slightly ulcerated skin along groin skin folds, between vagina and buttock. Some minor discharge from ulcerated skin On left labia minor, appears to be an erythematous mass Neurological:     Mental Status: She is alert and oriented to person, place, and time.  Psychiatric:        Mood and Affect: Mood normal.        Behavior: Behavior normal.      No results found for any visits on 09/18/23.  Assessment & Plan    Intertrigo -     Clotrimazole; Apply 1 Application topically 2 (two) times daily.  Dispense: 30 g; Refill: 0 -     Doxycycline Hyclate; Take 1 tablet (100 mg total) by mouth 2 (two) times daily for 7 days.  Dispense: 14 tablet; Refill: 0  Vulvar mass -     Ambulatory referral to Gynecology  Chest pain, unspecified type -     Ambulatory referral to Cardiology   Trial of topical cltrimazole, rx doxy bid x 7 days.  Referring to gyn for w/u of potential vulvar mass.   Pt would  prefer cardiology referral within [facility]. Given [facility] an [facility],she was referred to their providers. Referral to [facility] made today.  Return if symptoms worsen or fail to improve.       Trenton Frock, PA-C  [facility] [phone] (phone) [phone] (fax)  [facility]

## 2023-09-19 NOTE — Addendum Note (Signed)
 Addended byTrenton Frock on: 09/19/2023 10:29 AM   Modules accepted: Orders

## 2023-09-27 ENCOUNTER — Ambulatory Visit (INDEPENDENT_AMBULATORY_CARE_PROVIDER_SITE_OTHER): Admitting: Physician Assistant

## 2023-09-27 ENCOUNTER — Encounter: Admitting: Physician Assistant

## 2023-09-27 ENCOUNTER — Encounter: Payer: Self-pay | Admitting: Physician Assistant

## 2023-09-27 VITALS — BP 147/72 | HR 73 | Ht 64.0 in | Wt 187.8 lb

## 2023-09-27 DIAGNOSIS — I10 Essential (primary) hypertension: Secondary | ICD-10-CM | POA: Diagnosis not present

## 2023-09-27 DIAGNOSIS — R7303 Prediabetes: Secondary | ICD-10-CM

## 2023-09-27 DIAGNOSIS — R600 Localized edema: Secondary | ICD-10-CM

## 2023-09-27 DIAGNOSIS — N9089 Other specified noninflammatory disorders of vulva and perineum: Secondary | ICD-10-CM | POA: Diagnosis not present

## 2023-09-27 LAB — BASIC METABOLIC PANEL WITH GFR
BUN: 31 mg/dL — ABNORMAL HIGH (ref 6–23)
CO2: 24 meq/L (ref 19–32)
Calcium: 9.1 mg/dL (ref 8.4–10.5)
Chloride: 107 meq/L (ref 96–112)
Creatinine, Ser: 0.86 mg/dL (ref 0.40–1.20)
GFR: 60.93 mL/min (ref >60.00)
Glucose, Bld: 101 mg/dL — ABNORMAL HIGH (ref 70–99)
Potassium: 5 meq/L (ref 3.5–5.1)
Sodium: 141 meq/L (ref 135–145)

## 2023-09-27 LAB — LIPID PANEL
Cholesterol: 192 mg/dL (ref 0–200)
HDL: 60.5 mg/dL (ref >39.00)
LDL Cholesterol: 107 mg/dL — ABNORMAL HIGH (ref 0–99)
NonHDL: 131.88
Total CHOL/HDL Ratio: 3
Triglycerides: 125 mg/dL (ref 0.0–149.0)
VLDL: 25 mg/dL (ref 0.0–40.0)

## 2023-09-27 LAB — BRAIN NATRIURETIC PEPTIDE: Pro B Natriuretic peptide (BNP): 74 pg/mL (ref 0.0–100.0)

## 2023-09-27 LAB — HEMOGLOBIN A1C: Hgb A1c MFr Bld: 6 % (ref 4.6–6.5)

## 2023-09-27 NOTE — Assessment & Plan Note (Signed)
 Rash has cleared a bit, but pt still has a left sided vulvar mass. Referring to gyn

## 2023-09-27 NOTE — Assessment & Plan Note (Signed)
 Moderate control Cont amlodipine  2.5 mg  Ordering bmp  F/u 6 mo

## 2023-09-27 NOTE — Assessment & Plan Note (Signed)
 Repeat a1c

## 2023-09-27 NOTE — Progress Notes (Signed)
 Established patient visit   Patient: Jaime Mullins    DOB: [birth year]   89 y.o. Female  MRN: [id number] Visit Date: 09/27/2023  Today's healthcare provider: Trenton Frock, PA-C   Cc. cpe  Subjective    Discussed the use of AI scribe software for clinical note transcription with the patient, who gave verbal consent to proceed.  History of Present Illness   Jaime Mullins  is an 89 year old female who presents for a routine follow-up visit.  She experienced an allergic reaction to the recent doxycycline , repeated choking, but symptoms have resolved after discontinuing the medication.  She reports the topical cream has resolved nearly her whole vulvar rash.     Medications: Outpatient Medications Prior to Visit  Medication Sig   amLODipine  (NORVASC ) 2.5 MG tablet Take 1 tablet (2.5 mg total) by mouth daily.   clotrimazole  (CLOTRIMAZOLE  ANTI-FUNGAL) 1 % cream Apply 1 Application topically 2 (two) times daily.   famotidine (PEPCID) 40 MG tablet Take 40 mg by mouth daily.   furosemide (LASIX) 20 MG tablet Take 20 mg by mouth daily.   pantoprazole (PROTONIX) 40 MG tablet Take 40 mg by mouth daily.   No facility-administered medications prior to visit.    Review of Systems  Constitutional:  Negative for fatigue and fever.  Respiratory:  Negative for cough and shortness of breath.   Cardiovascular:  Negative for chest pain and leg swelling.  Gastrointestinal:  Negative for abdominal pain.  Neurological:  Negative for dizziness and headaches.       Objective    BP (!) 147/72   Pulse 73   Ht 5\' 4"  (1.626 m)   Wt 187 lb 12.8 oz (85.2 kg)   BMI 32.24 kg/m    Physical Exam Constitutional:      General: She is awake.     Appearance: She is well-developed. She is not ill-appearing.  HENT:     Head: Normocephalic.     Right Ear: Tympanic membrane normal.     Left Ear: Tympanic membrane normal.     Nose: Nose normal. No congestion or rhinorrhea.     Mouth/Throat:      Pharynx: No oropharyngeal exudate or posterior oropharyngeal erythema.  Eyes:     Conjunctiva/sclera: Conjunctivae normal.     Pupils: Pupils are equal, round, and reactive to light.  Neck:     Thyroid : No thyroid  mass or thyromegaly.  Cardiovascular:     Rate and Rhythm: Normal rate and regular rhythm.     Heart sounds: Normal heart sounds.  Pulmonary:     Effort: Pulmonary effort is normal.     Breath sounds: Normal breath sounds.  Abdominal:     Palpations: Abdomen is soft.     Tenderness: There is no abdominal tenderness.  Genitourinary:    Comments: Erythema improved, pt still has a left sided vulvar growth. Musculoskeletal:     Right lower leg: No swelling. Edema present.     Left lower leg: No swelling. Edema present.  Lymphadenopathy:     Cervical: No cervical adenopathy.  Skin:    General: Skin is warm.  Neurological:     Mental Status: She is alert and oriented to person, place, and time.  Psychiatric:        Attention and Perception: Attention normal.        Mood and Affect: Mood normal.        Speech: Speech normal.        Behavior: Behavior normal. Behavior  is cooperative.       No results found for any visits on 09/27/23.  Assessment & Plan    Essential hypertension Assessment & Plan: Moderate control Cont amlodipine  2.5 mg  Ordering bmp  F/u 6 mo  Orders: -     Basic metabolic panel with GFR -     Lipid panel  Prediabetes Assessment & Plan: Repeat a1c  Orders: -     Hemoglobin A1c  Localized edema Assessment & Plan: Non pitting  Takes lasix 20 mg  Will order bnp as pt reports worse than usual  Orders: -     Brain natriuretic peptide  Vulvar mass Assessment & Plan: Rash has cleared a bit, but pt still has a left sided vulvar mass. Referring to gyn  Orders: -     Ambulatory referral to Gynecology    Return in about 6 months (around 03/28/2024) for chronic conditions.       Trenton Frock, PA-C  [facility]  Primary Care at [facility] [phone] (phone) [phone] (fax)  [facility]

## 2023-09-27 NOTE — Assessment & Plan Note (Signed)
 Non pitting  Takes lasix 20 mg  Will order bnp as pt reports worse than usual

## 2023-09-28 ENCOUNTER — Ambulatory Visit: Payer: Self-pay | Admitting: Physician Assistant

## 2023-10-02 ENCOUNTER — Ambulatory Visit (INDEPENDENT_AMBULATORY_CARE_PROVIDER_SITE_OTHER)

## 2023-10-02 VITALS — Ht 64.0 in | Wt 187.0 lb

## 2023-10-02 DIAGNOSIS — Z0000 Encounter for general adult medical examination without abnormal findings: Secondary | ICD-10-CM

## 2023-10-02 NOTE — Addendum Note (Signed)
 Addended by: Seabron Cypress B on: 10/02/2023 11:31 AM   Modules accepted: Level of Service

## 2023-10-02 NOTE — Patient Instructions (Signed)
 Jaime Mullins  , Thank you for taking time out of your busy schedule to complete your Annual Wellness Visit with me. I enjoyed our conversation and look forward to speaking with you again next year. I, as well as your care team,  appreciate your ongoing commitment to your health goals. Please review the following plan we discussed and let me know if I can assist you in the future. Your Game plan/ To Do List    Follow up Visits: Next Medicare AWV with our clinical staff: In 1 year    Have you seen your provider in the last 6 months (3 months if uncontrolled diabetes)? Yes Next Office Visit with your provider: To be scheduled  Clinician Recommendations:  Aim for 30 minutes of exercise or brisk walking, 6-8 glasses of water, and 5 servings of fruits and vegetables each day.       This is a list of the screening recommended for you and due dates:  Health Maintenance  Topic Date Due   Zoster (Shingles) Vaccine (1 of 2) Never done   DEXA scan (bone density measurement)  Never done   Pneumonia Vaccine (2 of 2 - PPSV23) 03/15/2019   DTaP/Tdap/Td vaccine (2 - Td or Tdap) 07/24/2022   COVID-19 Vaccine (1 - 2024-25 season) Never done   Flu Shot  11/23/2023   Medicare Annual Wellness Visit  10/01/2024   HPV Vaccine  Aged Out   Meningitis B Vaccine  Aged Out    Advanced directives: (ACP Link)Information on Advanced Care Planning can be found at [place]  Secretary of [facility] Advance Health Care Directives Advance Health Care Directives. [email or web address]   Advance Care Planning is important because it:  [x]  Makes sure you receive the medical care that is consistent with your values, goals, and preferences  [x]  It provides guidance to your family and loved ones and reduces their decisional burden about whether or not they are making the right decisions based on your wishes.  Follow the link provided in your after visit summary or read over the paperwork we have mailed to you to help you started  getting your Advance Directives in place. If you need assistance in completing these, please reach out to us  so that we can help you!  See attachments for Preventive Care and Fall Prevention Tips.

## 2023-10-02 NOTE — Progress Notes (Addendum)
 Subjective:   Jaime Mullins  is a 89 y.o. who presents for a Medicare Wellness preventive visit.  As a reminder, Annual Wellness Visits don't include a physical exam, and some assessments may be limited, especially if this visit is performed virtually. We may recommend an in-person follow-up visit with your provider if needed.  Visit Complete: Virtual I connected with  [name]  on 10/02/23 by a audio enabled telemedicine application and verified that I am speaking with the correct person using two identifiers.  Patient Location: Home  Provider Location: Home Office  I discussed the limitations of evaluation and management by telemedicine. The patient expressed understanding and agreed to proceed.  Vital Signs: Because this visit was a virtual/telehealth visit, some criteria may be missing or patient reported. Any vitals not documented were not able to be obtained and vitals that have been documented are patient reported.  VideoDeclined- This patient declined Librarian, academic. Therefore the visit was completed with audio only.  Persons Participating in Visit: Patient.  AWV Questionnaire: No: Patient Medicare AWV questionnaire was not completed prior to this visit.  Cardiac Risk Factors include: advanced age (>72men, >40 women);hypertension;sedentary lifestyle     Objective:     Today's Vitals   10/02/23 1115  Weight: 187 lb (84.8 kg)  Height: 5\' 4"  (1.626 m)   Body mass index is 32.1 kg/m.     10/02/2023   11:28 AM 01/11/2021   11:29 AM  Advanced Directives  Does Patient Have a Medical Advance Directive? No No  Would patient like information on creating a medical advance directive? Yes (MAU/Ambulatory/Procedural Areas - Information given) No - Patient declined    Current Medications (verified) Outpatient Encounter Medications as of 10/02/2023  Medication Sig   amLODipine  (NORVASC ) 2.5 MG tablet Take 1 tablet (2.5 mg total) by  mouth daily.   clotrimazole  (CLOTRIMAZOLE  ANTI-FUNGAL) 1 % cream Apply 1 Application topically 2 (two) times daily.   famotidine (PEPCID) 40 MG tablet Take 40 mg by mouth daily.   furosemide (LASIX) 20 MG tablet Take 20 mg by mouth daily.   pantoprazole (PROTONIX) 40 MG tablet Take 40 mg by mouth daily.   No facility-administered encounter medications on file as of 10/02/2023.    Allergies (verified) Penicillins, Doxycycline , and Nsaids   History: Past Medical History:  Diagnosis Date   Arthritis    Hypertension    Past Surgical History:  Procedure Laterality Date   ABDOMINAL HYSTERECTOMY     BACK SURGERY     CHOLECYSTECTOMY     HERNIA REPAIR     NECK SURGERY     Family History  Problem Relation Age of Onset   Breast cancer Mother    Breast cancer Sister    Breast cancer Cousin    Social History   Socioeconomic History   Marital status: Married    Spouse name: Not on file   Number of children: Not on file   Years of education: Not on file   Highest education level: Not on file  Occupational History   Not on file  Tobacco Use   Smoking status: Never   Smokeless tobacco: Never  Vaping Use   Vaping status: Never Used  Substance and Sexual Activity   Alcohol  use: Never   Drug use: Never   Sexual activity: Never  Other Topics Concern   Not on file  Social History Narrative   Not on file   Social Drivers of Health   Financial Resource Strain: Low Risk  (  10/02/2023)   Overall Financial Resource Strain (CARDIA)    Difficulty of Paying Living Expenses: Not hard at all  Food Insecurity: No Food Insecurity (10/02/2023)   Hunger Vital Sign    Worried About Running Out of Food in the Last Year: Never true    Ran Out of Food in the Last Year: Never true  Transportation Needs: No Transportation Needs (10/02/2023)   PRAPARE - Administrator, Civil Service (Medical): No    Lack of Transportation (Non-Medical): No  Physical Activity: Inactive (10/02/2023)    Exercise Vital Sign    Days of Exercise per Week: 0 days    Minutes of Exercise per Session: 0 min  Stress: No Stress Concern Present (10/02/2023)   [employer] of Occupational Health - Occupational Stress Questionnaire    Feeling of Stress : Not at all  Social Connections: Moderately Integrated (10/02/2023)   Social Connection and Isolation Panel [NHANES]    Frequency of Communication with Friends and Family: More than three times a week    Frequency of Social Gatherings with Friends and Family: Three times a week    Attends Religious Services: 1 to 4 times per year    Active Member of Clubs or Organizations: No    Attends Banker Meetings: Never    Marital Status: Married    Tobacco Counseling Counseling given: Not Answered    Clinical Intake:  Pre-visit preparation completed: Yes  Pain : No/denies pain     Diabetes: No  Lab Results  Component Value Date   HGBA1C 6.0 09/27/2023   HGBA1C 6.1 (H) 06/12/2023     How often do you need to have someone help you when you read instructions, pamphlets, or other written materials from your doctor or pharmacy?: 1 - Never  Interpreter Needed?: No  Information entered by :: Seabron Cypress LPN   Activities of Daily Living     10/02/2023   11:28 AM  In your present state of health, do you have any difficulty performing the following activities:  Hearing? 0  Vision? 0  Difficulty concentrating or making decisions? 0  Walking or climbing stairs? 0  Dressing or bathing? 0  Doing errands, shopping? 0  Preparing Food and eating ? N  Using the Toilet? N  In the past six months, have you accidently leaked urine? N  Do you have problems with loss of bowel control? N  Managing your Medications? N  Managing your Finances? N  Housekeeping or managing your Housekeeping? N    Patient Care Team: Trenton Frock, PA-C as PCP - General (Physician Assistant)  I have updated your Care Teams any recent Medical  Services you may have received from other providers in the past year.     Assessment:    This is a routine wellness examination for Jaime Mullins.  Hearing/Vision screen Hearing Screening - Comments:: Denies hearing difficulties   Vision Screening - Comments:: No vision problems; will schedule routine eye exam soon     Goals Addressed             This Visit's Progress    Remain active and independent         Depression Screen     10/02/2023   11:26 AM  PHQ 2/9 Scores  PHQ - 2 Score 0    Fall Risk     10/02/2023   11:28 AM  Fall Risk   Falls in the past year? 0  Number falls in past yr: 0  Injury with Fall? 0  Risk for fall due to : Impaired balance/gait;Impaired mobility  Follow up Falls prevention discussed;Education provided;Falls evaluation completed    MEDICARE RISK AT HOME:  Medicare Risk at Home Any stairs in or around the home?: No If so, are there any without handrails?: No Home free of loose throw rugs in walkways, pet beds, electrical cords, etc?: Yes Adequate lighting in your home to reduce risk of falls?: Yes Life alert?: No Use of a cane, walker or w/c?: Yes Grab bars in the bathroom?: Yes Shower chair or bench in shower?: No Elevated toilet seat or a handicapped toilet?: Yes  TIMED UP AND GO:  Was the test performed?  No  Cognitive Function: 6CIT completed        10/02/2023   11:28 AM  6CIT Screen  What Year? 0 points  What month? 0 points  What time? 0 points  Count back from 20 0 points  Months in reverse 0 points  Repeat phrase 0 points  Total Score 0 points    Immunizations Immunization History  Administered Date(s) Administered   Fluad Quad(high Dose 65+) 04/07/2022   Hep B, Unspecified 11/22/1998, 10/28/1999   Pneumococcal Conjugate-13 12/30/2014, 03/14/2018   Tdap 07/23/2012    Screening Tests Health Maintenance  Topic Date Due   Zoster Vaccines- Shingrix (1 of 2) Never done   DEXA SCAN  Never done   Pneumonia Vaccine  54+ Years old (2 of 2 - PPSV23) 03/15/2019   DTaP/Tdap/Td (2 - Td or Tdap) 07/24/2022   COVID-19 Vaccine (1 - 2024-25 season) Never done   INFLUENZA VACCINE  11/23/2023   Medicare Annual Wellness (AWV)  10/01/2024   HPV VACCINES  Aged Out   Meningococcal B Vaccine  Aged Out    Health Maintenance  Health Maintenance Due  Topic Date Due   Zoster Vaccines- Shingrix (1 of 2) Never done   DEXA SCAN  Never done   Pneumonia Vaccine 13+ Years old (2 of 2 - PPSV23) 03/15/2019   DTaP/Tdap/Td (2 - Td or Tdap) 07/24/2022   COVID-19 Vaccine (1 - 2024-25 season) Never done   Health Maintenance Items Addressed: Declines dexa and vaccines at this time   Additional Screening:  Vision Screening: Recommended annual ophthalmology exams for early detection of glaucoma and other disorders of the eye. Would you like a referral to an eye doctor? No    Dental Screening: Recommended annual dental exams for proper oral hygiene  Community Resource Referral / Chronic Care Management: CRR required this visit?  No   CCM required this visit?  No   Plan:    I have personally reviewed and noted the following in the patient's chart:   Medical and social history Use of alcohol , tobacco or illicit drugs  Current medications and supplements including opioid prescriptions. Patient is not currently taking opioid prescriptions. Functional ability and status Nutritional status Physical activity Advanced directives List of other physicians Hospitalizations, surgeries, and ER visits in previous 12 months Vitals Screenings to include cognitive, depression, and falls Referrals and appointments  In addition, I have reviewed and discussed with patient certain preventive protocols, quality metrics, and best practice recommendations. A written personalized care plan for preventive services as well as general preventive health recommendations were provided to patient.   Seabron Cypress [place],  [place]   1/61/0960   After Visit Summary: (MyChart) Due to this being a telephonic visit, the after visit summary with patients personalized plan was offered to patient via MyChart  Notes: Nothing significant to report at this time.

## 2023-11-05 ENCOUNTER — Telehealth: Payer: Self-pay | Admitting: Physician Assistant

## 2023-11-05 NOTE — Telephone Encounter (Signed)
 Copied from CRM [id number]. Topic: General - Other >> Nov 05, 2023  9:08 AM Jaime Mullins wrote: Reason for CRM: Patient is calling because she has an appointment tomorrow for heart care and needs to reschedule that appointment due to her having a fall and not going to make it

## 2023-11-05 NOTE — Telephone Encounter (Signed)
 Number to heartcare given. Attempted to triage but pt declined.

## 2023-11-06 ENCOUNTER — Ambulatory Visit

## 2023-11-15 ENCOUNTER — Other Ambulatory Visit: Payer: Self-pay | Admitting: Physician Assistant

## 2023-11-15 DIAGNOSIS — L304 Erythema intertrigo: Secondary | ICD-10-CM

## 2023-11-15 MED ORDER — CLOTRIMAZOLE 1 % EX CREA: 1.0000 | TOPICAL_CREAM | Freq: Two times a day (BID) | CUTANEOUS | 0 refills | Status: DC

## 2023-11-15 NOTE — Telephone Encounter (Signed)
 Last OV: 09/27/2023

## 2023-11-15 NOTE — Telephone Encounter (Signed)
 Copied from CRM [id number]. Topic: Clinical - Medication Refill >> Nov 15, 2023  9:54 AM Burnard DEL wrote: Medication: clotrimazole  (CLOTRIMAZOLE  ANTI-FUNGAL) 1 % cream  Has the patient contacted their pharmacy? No (Agent: If no, request that the patient contact the pharmacy for the refill. If patient does not wish to contact the pharmacy document the reason why and proceed with request.) (Agent: If yes, when and what did the pharmacy advise?)  This is the patient's preferred pharmacy:  [facility] [place], [place] - [address] [place] [address] Phone: [phone] Fax: [phone]  Is this the correct pharmacy for this prescription? Yes If no, delete pharmacy and type the correct one.   Has the prescription been filled recently? Yes **Has a reoccurring issue with her vagina. Was prescribed this cream in May** Is the patient out of the medication? No  Has the patient been seen for an appointment in the last year OR does the patient have an upcoming appointment? Yes  Can we respond through MyChart? No  Agent: Please be advised that Rx refills may take up to 3 business days. We ask that you follow-up with your pharmacy.

## 2023-11-26 ENCOUNTER — Other Ambulatory Visit: Payer: Self-pay | Admitting: Physician Assistant

## 2023-11-26 NOTE — Telephone Encounter (Unsigned)
 Copied from CRM #[id number]. Topic: Clinical - Medication Refill >> Nov 26, 2023  3:50 PM Thersia C wrote: Medication: amLODipine  (NORVASC ) 2.5 MG tablet  Has the patient contacted their pharmacy? Yes (Agent: If no, request that the patient contact the pharmacy for the refill. If patient does not wish to contact the pharmacy document the reason why and proceed with request.) (Agent: If yes, when and what did the pharmacy advise?)  This is the patient's preferred pharmacy:  [facility] [place], [place] - [address] [place] [address] Phone: [phone] Fax: [phone]  Is this the correct pharmacy for this prescription? Yes If no, delete pharmacy and type the correct one.   Has the prescription been filled recently? No  Is the patient out of the medication? Yes  Has the patient been seen for an appointment in the last year OR does the patient have an upcoming appointment? Yes  Can we respond through MyChart? Yes  Agent: Please be advised that Rx refills may take up to 3 business days. We ask that you follow-up with your pharmacy.

## 2023-11-27 ENCOUNTER — Ambulatory Visit: Payer: Self-pay

## 2023-11-27 MED ORDER — AMLODIPINE BESYLATE 2.5 MG PO TABS: 2.5000 mg | ORAL_TABLET | Freq: Every day | ORAL | 1 refills | Status: AC

## 2023-11-27 NOTE — Telephone Encounter (Signed)
 Called [facility] and spoke to [facility] to inform of recommendation to ED & pt's is trying to decide whether to go out not.  Izetta stated she is not clinical & make sure I just put it in a note.

## 2023-11-27 NOTE — Telephone Encounter (Signed)
    FYI Only or Action Required?: FYI only for provider.  Patient was last seen in primary care on 09/27/2023.  Called Nurse Triage reporting Leg Swelling.  Symptoms began few weeks ago.  Interventions attempted: Nothing.  Symptoms are: gradually worsening.  Triage Disposition: Go to ED Now (Notify PCP)  Patient/caregiver understands and will follow disposition?: Yes    Copied from CRM [id number]. Topic: Clinical - Red Word Triage >> Nov 27, 2023  3:03 PM Drema MATSU wrote: Red Word that prompted transfer to Nurse Triage: Patient legs are swelling and she has shortness of breath which has increased.   ----------------------------------------------------------------------- From previous Reason for Contact - Scheduling: Patient/patient representative is calling to schedule an appointment. Refer to attachments for appointment information. Reason for Disposition  Difficulty breathing at rest  Answer Assessment - Initial Assessment Questions 1. ONSET: When did the swelling start? (e.g., minutes, hours, days)     X few weeks 2. LOCATION: What part of the leg is swollen?  Are both legs swollen or just one leg?     Bilateral leg swelling feet to knees 3. SEVERITY: How bad is the swelling? (e.g., localized; mild, moderate, severe)     Moderate  4. REDNESS: Is there redness or signs of infection?     no 5. PAIN: Is the swelling painful to touch? If Yes, ask: How painful is it?   (Scale 1-10; mild, moderate or severe)     6/10 6. FEVER: Do you have a fever? If Yes, ask: What is it, how was it measured, and when did it start?      no 7. CAUSE: What do you think is causing the leg swelling?     na 8. MEDICAL HISTORY: Do you have a history of blood clots (e.g., DVT), cancer, heart failure, kidney disease, or liver failure?     na 9. RECURRENT SYMPTOM: Have you had leg swelling before? If Yes, ask: When was the last time? What happened that time?     na 10.  OTHER SYMPTOMS: Do you have any other symptoms? (e.g., chest pain, difficulty breathing)       SOB for a while and increased  11. PREGNANCY: Is there any chance you are pregnant? When was your last menstrual period?       Na  Pt stated when laying down the fluid wakes her up. Daughter stated pt wheezing & increased SOB.  Nurse recommended pt go to ED due to increased bilateral LE edema, increased SOB: daughter verbalized understanding however did not state if pt will be going to ED or not.  Daughter stated she would inform pt and let pt decide.  Protocols used: Leg Swelling and Edema-A-AH

## 2023-11-27 NOTE — Telephone Encounter (Signed)
 Spoke with pt and encouraged her to go to ED.  Pt voiced understanding and said she would go.

## 2023-12-03 ENCOUNTER — Ambulatory Visit: Payer: Self-pay | Admitting: Physician Assistant

## 2023-12-03 NOTE — Telephone Encounter (Signed)
 FYI Only or Action Required?: FYI only for provider.  Patient was last seen in primary care on 09/27/2023.  Called Nurse Triage reporting Leg Swelling.  Symptoms began several years ago.  Interventions attempted: Prescription medications: Lasix  20 mg daily.  Symptoms are: gradually improving.  Triage Disposition: See PCP Within 2 Weeks  Patient/caregiver understands and will follow disposition?: Yes                      Copied from CRM [id number]. Topic: Clinical - Red Word Triage >> Dec 03, 2023 10:39 AM Jayma L wrote: Red Word that prompted transfer to Nurse Triage: patient daughter, Harland called in and stated patient is having a cough from the possible fluid in her lungs and just not feeling well and also stated theres swelling in both legs that not getting any better Reason for Disposition  [1] MILD swelling of both ankles (i.e., pedal edema) AND [2] is a chronic symptom (recurrent or ongoing AND present > 4 weeks)  Answer Assessment - Initial Assessment Questions This RN spoke with pt's daughter, Reena. Pt daughter states she had called in last week due to pt swelling in legs. Pt was not taking her diuretic as she should; pt now taking diuretic daily and leg swelling has gone down. Pt daughter states the pt only wants to see Manuelita Flatness, PA, so pt was scheduled for an appt next Tues, 8/19, as this is the only time that worked for pt daughter to bring pt in based off appt availability. This RN educated pt daughter on new-worsening symptoms and when to call back/seek emergent care. Pt daughter verbalized understanding and agrees to plan.   Swelling in legs chronic issue. Got worse last week and pt called in; pt has been taking diuretic and leg swelling has gone down Swelling below knees; denies redness Coughing a little bit yesterday, dry cough SOB for several years Denies fever, wheezing, runny nose, body aches  Protocols used: Cough - Acute  Productive-A-AH, Leg Swelling and Edema-A-AH

## 2023-12-03 NOTE — Telephone Encounter (Signed)
 Agree with triage go to ED if symptoms worsen before appt.  This was the earliest that worked w/ family

## 2023-12-11 ENCOUNTER — Ambulatory Visit (INDEPENDENT_AMBULATORY_CARE_PROVIDER_SITE_OTHER): Admitting: Physician Assistant

## 2023-12-11 ENCOUNTER — Ambulatory Visit: Payer: Self-pay | Admitting: Physician Assistant

## 2023-12-11 ENCOUNTER — Ambulatory Visit (HOSPITAL_BASED_OUTPATIENT_CLINIC_OR_DEPARTMENT_OTHER)
Admission: RE | Admit: 2023-12-11 | Discharge: 2023-12-11 | Disposition: A | Discharge status: Home / Self Care | Source: Ambulatory Visit | Attending: Physician Assistant | Admitting: Physician Assistant

## 2023-12-11 ENCOUNTER — Encounter: Payer: Self-pay | Admitting: Physician Assistant

## 2023-12-11 VITALS — BP 128/76 | HR 89 | Ht 64.0 in | Wt 188.8 lb

## 2023-12-11 DIAGNOSIS — R600 Localized edema: Secondary | ICD-10-CM

## 2023-12-11 DIAGNOSIS — R0989 Other specified symptoms and signs involving the circulatory and respiratory systems: Secondary | ICD-10-CM | POA: Insufficient documentation

## 2023-12-11 DIAGNOSIS — R5383 Other fatigue: Secondary | ICD-10-CM | POA: Diagnosis not present

## 2023-12-11 LAB — VITAMIN B12: Vitamin B-12: 645 pg/mL (ref 211–911)

## 2023-12-11 LAB — BASIC METABOLIC PANEL WITH GFR
BUN: 40 mg/dL — ABNORMAL HIGH (ref 6–23)
CO2: 30 meq/L (ref 19–32)
Calcium: 9.3 mg/dL (ref 8.4–10.5)
Chloride: 99 meq/L (ref 96–112)
Creatinine, Ser: 1.14 mg/dL (ref 0.40–1.20)
GFR: 43.38 mL/min — ABNORMAL LOW (ref >60.00)
Glucose, Bld: 142 mg/dL — ABNORMAL HIGH (ref 70–99)
Potassium: 4.3 meq/L (ref 3.5–5.1)
Sodium: 139 meq/L (ref 135–145)

## 2023-12-11 LAB — BRAIN NATRIURETIC PEPTIDE: Pro B Natriuretic peptide (BNP): 33 pg/mL (ref 0.0–100.0)

## 2023-12-11 NOTE — Progress Notes (Signed)
 Established patient visit   Patient: Jaime Mullins    DOB: Dec 05, 1934   89 y.o. Female  MRN: [id number] Visit Date: 12/11/2023  Today's healthcare provider: Manuelita Flatness, PA-C   Cc. Leg swelling, fatigue, SOB, weakness  Subjective    Pt presents with daughter today.   Discussed the use of AI scribe software for clinical note transcription with the patient, who gave verbal consent to proceed.  History of Present Illness   Jaime Mullins  is an 89 year old female with heart failure who presents with worsening shortness of breath and leg swelling.  She experiences worsening shortness of breath and leg swelling despite daily Lasix use. Fatigue and a feeling of being 'drained' after she takes Lasix. She called the office Last week, she was unable to get out of bed due to leg weakness, leading to a 911 call, though she did not go to the hospital. She feels panic and overwhelmed by her symptoms.  She reports leg weakness, particularly at night, and has a history of back problems and arthritis. She experiences low back pain and struggles with bladder and bowel control, worsened by Lasix. She takes amlodipine  for blood pressure, which has been low, and supplements with Gatorade to manage electrolytes.       Medications: Outpatient Medications Prior to Visit  Medication Sig   amLODipine  (NORVASC ) 2.5 MG tablet Take 1 tablet (2.5 mg total) by mouth daily.   clotrimazole  (CLOTRIMAZOLE  ANTI-FUNGAL) 1 % cream Apply 1 Application topically 2 (two) times daily.   famotidine (PEPCID) 40 MG tablet Take 40 mg by mouth daily.   furosemide (LASIX) 20 MG tablet Take 20 mg by mouth daily.   pantoprazole (PROTONIX) 40 MG tablet Take 40 mg by mouth daily.   No facility-administered medications prior to visit.    Review of Systems  Constitutional:  Negative for fatigue and fever.  Respiratory:  Positive for cough and shortness of breath.   Cardiovascular:  Negative for chest pain and leg  swelling.  Gastrointestinal:  Negative for abdominal pain.  Neurological:  Positive for weakness. Negative for dizziness and headaches.       Objective    BP 128/76   Pulse 89   Ht 5' 4 (1.626 m)   Wt 188 lb 12.8 oz (85.6 kg)   SpO2 94%   BMI 32.41 kg/m    Physical Exam Constitutional:      General: She is awake.     Appearance: She is well-developed.  HENT:     Head: Normocephalic.  Eyes:     Conjunctiva/sclera: Conjunctivae normal.  Cardiovascular:     Rate and Rhythm: Normal rate and regular rhythm.     Heart sounds: Normal heart sounds.  Pulmonary:     Effort: Pulmonary effort is normal.     Comments: Crackles left lung base Musculoskeletal:     Comments: 1+ pitting edema bilaterally.  Bilateral pulses intact, appropriately warm and pink.    Skin:    General: Skin is warm.  Neurological:     Mental Status: She is alert and oriented to person, place, and time.  Psychiatric:        Attention and Perception: Attention normal.        Mood and Affect: Mood normal.        Speech: Speech normal.        Behavior: Behavior is cooperative.     No results found for any visits on 12/11/23.  Assessment & Plan    Abnormal lung  sounds -     DG Chest 2 View  Other fatigue -     Vitamin B12  Localized edema -     Brain natriuretic peptide -     Basic metabolic panel with GFR  Bilat peripheral edema is stable from my perspective.  Check bmp, bnp - Order chest x-ray given LLQ crackles r/o effusion - Encourage adherence to cardiology follow-up.  - Continue Lasix daily, monitor for symptoms of hypotension  Feelings of sadness and frustration with declining health and independence. - Encourage open discussion       Return if symptoms worsen or fail to improve.       Manuelita Flatness, PA-C  [facility] [phone] (phone) [phone] (fax)  [facility]

## 2023-12-12 ENCOUNTER — Other Ambulatory Visit: Payer: Self-pay | Admitting: *Deleted

## 2023-12-20 ENCOUNTER — Other Ambulatory Visit: Payer: Self-pay | Admitting: Physician Assistant

## 2023-12-20 DIAGNOSIS — L304 Erythema intertrigo: Secondary | ICD-10-CM

## 2023-12-20 MED ORDER — CLOTRIMAZOLE 1 % EX CREA: 1.0000 | TOPICAL_CREAM | Freq: Two times a day (BID) | CUTANEOUS | 0 refills | Status: AC

## 2023-12-20 MED ORDER — FUROSEMIDE 20 MG PO TABS: 20.0000 mg | ORAL_TABLET | Freq: Every day | ORAL | 0 refills | Status: DC

## 2023-12-20 NOTE — Telephone Encounter (Unsigned)
 Copied from CRM [id number]. Topic: Clinical - Medication Refill >> Dec 20, 2023  9:10 AM Jasmin G wrote: Medication: furosemide  (LASIX ) 20 MG tablet clotrimazole  (CLOTRIMAZOLE  ANTI-FUNGAL) 1 % cream  Has the patient contacted their pharmacy? No (Agent: If no, request that the patient contact the pharmacy for the refill. If patient does not wish to contact the pharmacy document the reason why and proceed with request.) (Agent: If yes, when and what did the pharmacy advise?)  This is the patient's preferred pharmacy:  [facility] [place], [place] - [address] [place] [address] Phone: [phone] Fax: [phone]  Is this the correct pharmacy for this prescription? Yes If no, delete pharmacy and type the correct one.   Has the prescription been filled recently? No  Is the patient out of the medication? Yes  Has the patient been seen for an appointment in the last year OR does the patient have an upcoming appointment? Yes  Can we respond through MyChart? No  Agent: Please be advised that Rx refills may take up to 3 business days. We ask that you follow-up with your pharmacy.

## 2024-01-15 ENCOUNTER — Ambulatory Visit: Admitting: Cardiology

## 2024-01-16 ENCOUNTER — Ambulatory Visit: Payer: Self-pay | Admitting: *Deleted

## 2024-01-16 NOTE — Telephone Encounter (Signed)
 Appt scheduled

## 2024-01-16 NOTE — Telephone Encounter (Signed)
 Copied from CRM [id number]. Topic: Clinical - Red Word Triage >> Jan 16, 2024  8:35 AM Deaijah H wrote: Red Word that prompted transfer to Nurse Triage: Consultation for swelling in ankles along w/ fluid w/ Dr. Domenica (TOC) Answer Assessment - Initial Assessment Questions 1. LOCATION: Which ankle is swollen? Where is the swelling?     Jaime Mullins, daughter calling in. I'm calling because she was seeing Manuelita Flatness, PA-C.     We just want to transfer her care to Dr. Carleton since Manuelita has left.  My mother is fine we just need to transfer her care to a new doctor.  No ankle swelling now.   She is fine.  No triage needed. I let her know Dr. Carleton was not taking new pts.   Decided to transfer care to Waddell Mon, NP so scheduled with Waddell for 04/03/2024 at 1:20.    2. ONSET: When did the swelling start?      3. SWELLING: How bad is the swelling? Or, How large is it? (e.g., mild, moderate, severe; size of localized swelling)       4. PAIN: Is there any pain? If Yes, ask: How bad is it? (Scale 0-10; or none, mild, moderate, severe)      5. CAUSE: What do you think caused the ankle swelling?      6. OTHER SYMPTOMS: Do you have any other symptoms? (e.g., fever, chest pain, difficulty breathing, calf pain)      7. PREGNANCY: Is there any chance you are pregnant? When was your last menstrual period?  Protocols used: Ankle Swelling-A-AH FYI Only or Action Required?: FYI only for provider.  Patient was last seen in primary care on 12/11/2023 by Flatness Manuelita, PA-C.  Called Nurse Triage reporting Joint Swelling.No ankle swelling or issues at this time.   Just wanted transfer of care appt made.   Symptoms began No issues   Daughter, Jaime Mullins called in for transfer of care to Waddell Mon, NP since Manuelita Flatness, PA-C has left the practice.   No triage needed.   No issues with ankle swelling    Just wanted to set up appt to transfer care. .  Interventions attempted: Other: No  issues.  Symptoms are: stable.  Triage Disposition: No disposition on file.No issues   Just wanted a transfer of care appt since Manuelita Flatness, PA-C left the practice.   Set up with Waddell Mon, NP for 04/03/2024 at 1:20 TOC appt.  Patient/caregiver understands and will follow disposition?:  Yes will come to appt

## 2024-02-20 ENCOUNTER — Other Ambulatory Visit: Payer: Self-pay | Admitting: Neurology

## 2024-02-20 MED ORDER — FUROSEMIDE 20 MG PO TABS: 20.0000 mg | ORAL_TABLET | Freq: Every day | ORAL | 0 refills | Status: AC

## 2024-02-22 ENCOUNTER — Ambulatory Visit: Admitting: Internal Medicine

## 2024-04-03 ENCOUNTER — Encounter: Admitting: Family Medicine

## 2024-04-03 DIAGNOSIS — R7303 Prediabetes: Secondary | ICD-10-CM

## 2024-04-03 DIAGNOSIS — I10 Essential (primary) hypertension: Secondary | ICD-10-CM

## 2024-04-03 DIAGNOSIS — Z0000 Encounter for general adult medical examination without abnormal findings: Secondary | ICD-10-CM

## 2024-04-03 NOTE — Progress Notes (Incomplete)
 New Patient Office Visit   Subjective     Patient ID: Javanna Patin , female   DOB: July 05, 1934  Age: 89 y.o. MRN: [id number]   CC:  No chief complaint on file.     HPI Yamari Demos  presents to establish care.     Hypertension: - Medications: Amlodipine  2.5 mg daily, Furosemide  20 mg daily.  - Compliance: *** - Checking BP at home: *** - Denies any SOB, recurrent headaches, CP, vision changes, LE edema, dizziness, palpitations, or medication side effects. - Diet: *** - Exercise: *** - Stressors:  Pre-Diabetes: - Checking glucose at home: *** - Medications: *** - Compliance: *** - Diet: *** - Exercise: *** - Denies symptoms of hypoglycemia, polyuria, polydipsia, numbness extremities, foot ulcers/trauma, wounds that are not healing, medication side effects  Lab Results  Component Value Date   HGBA1C 6.0 09/27/2023     Show/hide medication list[1] Past Medical History:  Diagnosis Date   Arthritis    Hypertension     Past Surgical History:  Procedure Laterality Date   ABDOMINAL HYSTERECTOMY     BACK SURGERY     CHOLECYSTECTOMY     HERNIA REPAIR     NECK SURGERY       Family History  Problem Relation Age of Onset   Breast cancer Mother    Breast cancer Sister    Breast cancer Cousin     Social History   Socioeconomic History   Marital status: Married    Spouse name: Not on file   Number of children: Not on file   Years of education: Not on file   Highest education level: Not on file  Occupational History   Not on file  Tobacco Use   Smoking status: Never   Smokeless tobacco: Never  Vaping Use   Vaping status: Never Used  Substance and Sexual Activity   Alcohol  use: Never   Drug use: Never   Sexual activity: Never  Other Topics Concern   Not on file  Social History Narrative   Not on file   Social Drivers of Health   Tobacco Use: Low Risk (02/05/2024)   Patient History    Smoking Tobacco Use: Never    Smokeless Tobacco Use:  Never    Passive Exposure: Not on file  Financial Resource Strain: Low Risk (10/02/2023)   Overall Financial Resource Strain (CARDIA)    Difficulty of Paying Living Expenses: Not hard at all  Food Insecurity: No Food Insecurity (10/02/2023)   Hunger Vital Sign    Worried About Running Out of Food in the Last Year: Never true    Ran Out of Food in the Last Year: Never true  Transportation Needs: No Transportation Needs (10/02/2023)   PRAPARE - Administrator, Civil Service (Medical): No    Lack of Transportation (Non-Medical): No  Physical Activity: Inactive (10/02/2023)   Exercise Vital Sign    Days of Exercise per Week: 0 days    Minutes of Exercise per Session: 0 min  Stress: No Stress Concern Present (10/02/2023)   [employer] of Occupational Health - Occupational Stress Questionnaire    Feeling of Stress : Not at all  Social Connections: Moderately Integrated (10/02/2023)   Social Connection and Isolation Panel    Frequency of Communication with Friends and Family: More than three times a week    Frequency of Social Gatherings with Friends and Family: Three times a week    Attends Religious Services: 1 to 4 times per  year    Active Member of Clubs or Organizations: No    Attends Banker Meetings: Never    Marital Status: Married  Depression (PHQ2-9): Medium Risk (12/11/2023)   Depression (PHQ2-9)    PHQ-2 Score: 7  Alcohol  Screen: Low Risk (10/02/2023)   Alcohol  Screen    Last Alcohol  Screening Score (AUDIT): 0  Housing: Unknown (10/02/2023)   Housing Stability Vital Sign    Unable to Pay for Housing in the Last Year: No    Number of Times Moved in the Last Year: Not on file    Homeless in the Last Year: No  Utilities: Not At Risk (10/02/2023)   AHC Utilities    Threatened with loss of utilities: No  Health Literacy: Adequate Health Literacy (10/02/2023)   B1300 Health Literacy    Frequency of need for help with medical instructions: Never        ROS All review of systems negative except what is listed in the HPI    Objective     There were no vitals taken for this visit.  Physical Exam     Assessment & Plan:     Problem List Items Addressed This Visit       Cardiovascular and Mediastinum   Essential hypertension   Relevant Medications   aspirin 81 MG chewable tablet     Other   Prediabetes   Other Visit Diagnoses       Encounter for medical examination to establish care    -  Primary          No follow-ups on file.  Waddell KATHEE Mon, NP  I,Emily Lagle,acting as a scribe for Waddell KATHEE Mon, NP.,have documented all relevant documentation on the behalf of Waddell KATHEE Mon, NP.  I, Waddell KATHEE Mon, NP, have reviewed all documentation for this visit. The documentation on 04/03/2024 for the exam, diagnosis, procedures, and orders are all accurate and complete.    [1]  Outpatient Medications Prior to Visit  Medication Sig   aspirin 81 MG chewable tablet Chew 81 mg by mouth daily.   amLODipine  (NORVASC ) 2.5 MG tablet Take 1 tablet (2.5 mg total) by mouth daily.   clotrimazole  (CLOTRIMAZOLE  ANTI-FUNGAL) 1 % cream Apply 1 Application topically 2 (two) times daily.   famotidine (PEPCID) 40 MG tablet Take 40 mg by mouth daily.   furosemide  (LASIX ) 20 MG tablet Take 1 tablet (20 mg total) by mouth daily.   pantoprazole (PROTONIX) 40 MG tablet Take 40 mg by mouth daily.   No facility-administered medications prior to visit.

## 2024-10-07 ENCOUNTER — Ambulatory Visit
